# Patient Record
Sex: Female | Born: 1980 | Race: White | Hispanic: No | Marital: Married | Smoking: Never smoker
Health system: Southern US, Community
[De-identification: ages and names within clinical notes are randomized; demographics above are authoritative.]

## PROBLEM LIST (undated history)

## (undated) DIAGNOSIS — Z87442 Personal history of urinary calculi: Secondary | ICD-10-CM

## (undated) DIAGNOSIS — T8859XA Other complications of anesthesia, initial encounter: Secondary | ICD-10-CM

## (undated) DIAGNOSIS — D649 Anemia, unspecified: Secondary | ICD-10-CM

## (undated) DIAGNOSIS — O24419 Gestational diabetes mellitus in pregnancy, unspecified control: Secondary | ICD-10-CM

## (undated) DIAGNOSIS — Z8619 Personal history of other infectious and parasitic diseases: Secondary | ICD-10-CM

## (undated) DIAGNOSIS — R51 Headache: Secondary | ICD-10-CM

## (undated) DIAGNOSIS — O409XX0 Polyhydramnios, unspecified trimester, not applicable or unspecified: Secondary | ICD-10-CM

## (undated) DIAGNOSIS — E229 Hyperfunction of pituitary gland, unspecified: Secondary | ICD-10-CM

## (undated) DIAGNOSIS — Z9889 Other specified postprocedural states: Secondary | ICD-10-CM

## (undated) DIAGNOSIS — R112 Nausea with vomiting, unspecified: Secondary | ICD-10-CM

## (undated) DIAGNOSIS — T4145XA Adverse effect of unspecified anesthetic, initial encounter: Secondary | ICD-10-CM

## (undated) DIAGNOSIS — F419 Anxiety disorder, unspecified: Secondary | ICD-10-CM

## (undated) DIAGNOSIS — R569 Unspecified convulsions: Secondary | ICD-10-CM

## (undated) HISTORY — DX: Headache: R51

## (undated) HISTORY — DX: Anxiety disorder, unspecified: F41.9

## (undated) HISTORY — DX: Personal history of urinary calculi: Z87.442

## (undated) HISTORY — DX: Anemia, unspecified: D64.9

## (undated) HISTORY — DX: Personal history of other infectious and parasitic diseases: Z86.19

## (undated) HISTORY — DX: Hyperfunction of pituitary gland, unspecified: E22.9

## (undated) HISTORY — PX: WISDOM TOOTH EXTRACTION: SHX21

## (undated) HISTORY — DX: Polyhydramnios, unspecified trimester, not applicable or unspecified: O40.9XX0

---

## 2007-06-18 HISTORY — PX: BREAST SURGERY: SHX581

## 2008-02-10 ENCOUNTER — Encounter: Admission: RE | Admit: 2008-02-10 | Discharge: 2008-02-10 | Payer: Self-pay | Admitting: Obstetrics and Gynecology

## 2008-05-16 ENCOUNTER — Ambulatory Visit (HOSPITAL_BASED_OUTPATIENT_CLINIC_OR_DEPARTMENT_OTHER): Admission: RE | Admit: 2008-05-16 | Discharge: 2008-05-16 | Payer: Self-pay | Admitting: Internal Medicine

## 2008-05-18 ENCOUNTER — Encounter: Admission: RE | Admit: 2008-05-18 | Discharge: 2008-05-18 | Payer: Self-pay | Admitting: Obstetrics and Gynecology

## 2008-10-28 ENCOUNTER — Encounter: Admission: RE | Admit: 2008-10-28 | Discharge: 2008-10-28 | Payer: Self-pay | Admitting: Obstetrics and Gynecology

## 2010-07-08 ENCOUNTER — Encounter: Payer: Self-pay | Admitting: Obstetrics and Gynecology

## 2012-04-02 LAB — OB RESULTS CONSOLE RUBELLA ANTIBODY, IGM: Rubella: IMMUNE

## 2012-04-02 LAB — OB RESULTS CONSOLE ABO/RH: RH Type: POSITIVE

## 2012-04-02 LAB — OB RESULTS CONSOLE HEPATITIS B SURFACE ANTIGEN: Hepatitis B Surface Ag: NEGATIVE

## 2012-04-02 LAB — OB RESULTS CONSOLE HIV ANTIBODY (ROUTINE TESTING): HIV: NONREACTIVE

## 2012-04-02 LAB — OB RESULTS CONSOLE RPR: RPR: NONREACTIVE

## 2012-06-17 NOTE — L&D Delivery Note (Signed)
Delivery Note At 6:01 PM a viable and healthy female was delivered via  (Presentation: LOA ).  APGAR: 9, 9; weight pending.   Placenta status: spontaneous ,intact .  Cord:  with the following complications: [place] x one reduced.  Cord pH: na  Anesthesia: Epidural  Episiotomy: none Lacerations: second degree Suture Repair: 2.0 vicryl rapide Est. Blood Loss (mL): 300  Mom to postpartum.  Baby to nursery-stable.  Tari Lecount J 10/22/2012, 6:15 PM

## 2012-10-19 ENCOUNTER — Encounter (HOSPITAL_COMMUNITY): Payer: Self-pay | Admitting: *Deleted

## 2012-10-19 ENCOUNTER — Telehealth (HOSPITAL_COMMUNITY): Payer: Self-pay | Admitting: *Deleted

## 2012-10-19 NOTE — Telephone Encounter (Signed)
Preadmission screen  

## 2012-10-20 ENCOUNTER — Other Ambulatory Visit: Payer: Self-pay | Admitting: Obstetrics and Gynecology

## 2012-10-21 ENCOUNTER — Inpatient Hospital Stay (HOSPITAL_COMMUNITY)
Admission: AD | Admit: 2012-10-21 | Discharge: 2012-10-24 | DRG: 775 | Disposition: A | Discharge status: Home / Self Care | Payer: 59 | Source: Ambulatory Visit | Attending: Obstetrics and Gynecology | Admitting: Obstetrics and Gynecology

## 2012-10-21 ENCOUNTER — Encounter (HOSPITAL_COMMUNITY): Payer: Self-pay | Admitting: *Deleted

## 2012-10-21 DIAGNOSIS — O368190 Decreased fetal movements, unspecified trimester, not applicable or unspecified: Secondary | ICD-10-CM | POA: Diagnosis not present

## 2012-10-21 DIAGNOSIS — O409XX0 Polyhydramnios, unspecified trimester, not applicable or unspecified: Secondary | ICD-10-CM | POA: Diagnosis present

## 2012-10-21 DIAGNOSIS — D62 Acute posthemorrhagic anemia: Secondary | ICD-10-CM | POA: Diagnosis not present

## 2012-10-21 DIAGNOSIS — O3660X0 Maternal care for excessive fetal growth, unspecified trimester, not applicable or unspecified: Principal | ICD-10-CM | POA: Diagnosis present

## 2012-10-21 DIAGNOSIS — O99892 Other specified diseases and conditions complicating childbirth: Secondary | ICD-10-CM | POA: Diagnosis present

## 2012-10-21 DIAGNOSIS — O9903 Anemia complicating the puerperium: Secondary | ICD-10-CM | POA: Diagnosis not present

## 2012-10-21 DIAGNOSIS — Z22330 Carrier of Group B streptococcus: Secondary | ICD-10-CM

## 2012-10-21 LAB — TYPE AND SCREEN
ABO/RH(D): O POS
Antibody Screen: NEGATIVE

## 2012-10-21 LAB — CBC
MCH: 31.6 pg (ref 26.0–34.0)
MCHC: 35.5 g/dL (ref 30.0–36.0)
Platelets: 277 10*3/uL (ref 150–400)
RDW: 13.8 % (ref 11.5–15.5)

## 2012-10-21 MED ORDER — TERBUTALINE SULFATE 1 MG/ML IJ SOLN: 0.2500 mg | Freq: Once | INTRAMUSCULAR | Status: AC | PRN

## 2012-10-21 MED ORDER — LACTATED RINGERS IV SOLN: 500.0000 mL | INTRAVENOUS | Status: DC | PRN

## 2012-10-21 MED ORDER — ONDANSETRON HCL 4 MG/2ML IJ SOLN: 4.0000 mg | Freq: Four times a day (QID) | INTRAMUSCULAR | Status: DC | PRN

## 2012-10-21 MED ORDER — FLEET ENEMA 7-19 GM/118ML RE ENEM: 1.0000 | ENEMA | RECTAL | Status: DC | PRN

## 2012-10-21 MED ORDER — LIDOCAINE HCL (PF) 1 % IJ SOLN
30.0000 mL | INTRAMUSCULAR | Status: DC | PRN
  Filled 2012-10-21 (×2): qty 30

## 2012-10-21 MED ORDER — OXYTOCIN BOLUS FROM INFUSION: 500.0000 mL | INTRAVENOUS | Status: DC

## 2012-10-21 MED ORDER — OXYCODONE-ACETAMINOPHEN 5-325 MG PO TABS: 1.0000 | ORAL_TABLET | ORAL | Status: DC | PRN

## 2012-10-21 MED ORDER — ZOLPIDEM TARTRATE 5 MG PO TABS
5.0000 mg | ORAL_TABLET | Freq: Every evening | ORAL | Status: DC | PRN
  Administered 2012-10-21: 5 mg via ORAL
  Filled 2012-10-21: qty 1

## 2012-10-21 MED ORDER — PENICILLIN G POTASSIUM 5000000 UNITS IJ SOLR: 5.0000 10*6.[IU] | Freq: Once | INTRAVENOUS | Status: DC

## 2012-10-21 MED ORDER — MISOPROSTOL 25 MCG QUARTER TABLET
25.0000 ug | ORAL_TABLET | ORAL | Status: DC | PRN
  Administered 2012-10-21 – 2012-10-22 (×2): 25 ug via VAGINAL
  Filled 2012-10-21 (×2): qty 0.25
  Filled 2012-10-21: qty 1

## 2012-10-21 MED ORDER — PENICILLIN G POTASSIUM 5000000 UNITS IJ SOLR
5.0000 10*6.[IU] | Freq: Once | INTRAVENOUS | Status: DC
  Administered 2012-10-21: 5 10*6.[IU] via INTRAVENOUS
  Filled 2012-10-21: qty 5

## 2012-10-21 MED ORDER — LIDOCAINE HCL (PF) 1 % IJ SOLN: 30.0000 mL | INTRAMUSCULAR | Status: DC | PRN

## 2012-10-21 MED ORDER — CITRIC ACID-SODIUM CITRATE 334-500 MG/5ML PO SOLN
30.0000 mL | ORAL | Status: DC | PRN
  Administered 2012-10-21: 30 mL via ORAL
  Filled 2012-10-21: qty 15

## 2012-10-21 MED ORDER — PENICILLIN G POTASSIUM 5000000 UNITS IJ SOLR: 2.5000 10*6.[IU] | INTRAVENOUS | Status: DC

## 2012-10-21 MED ORDER — ACETAMINOPHEN 325 MG PO TABS: 650.0000 mg | ORAL_TABLET | ORAL | Status: DC | PRN

## 2012-10-21 MED ORDER — IBUPROFEN 600 MG PO TABS
600.0000 mg | ORAL_TABLET | Freq: Four times a day (QID) | ORAL | Status: DC | PRN
  Administered 2012-10-22: 600 mg via ORAL
  Filled 2012-10-21: qty 1

## 2012-10-21 MED ORDER — PENICILLIN G POTASSIUM 5000000 UNITS IJ SOLR
5.0000 10*6.[IU] | Freq: Once | INTRAVENOUS | Status: AC
  Administered 2012-10-22: 5 10*6.[IU] via INTRAVENOUS
  Filled 2012-10-21: qty 5

## 2012-10-21 MED ORDER — OXYTOCIN 40 UNITS IN LACTATED RINGERS INFUSION - SIMPLE MED: 62.5000 mL/h | INTRAVENOUS | Status: DC

## 2012-10-21 MED ORDER — LACTATED RINGERS IV SOLN
INTRAVENOUS | Status: DC
  Administered 2012-10-21: 20:00:00 via INTRAVENOUS

## 2012-10-21 MED ORDER — CITRIC ACID-SODIUM CITRATE 334-500 MG/5ML PO SOLN: 30.0000 mL | ORAL | Status: DC | PRN

## 2012-10-21 MED ORDER — BUTORPHANOL TARTRATE 1 MG/ML IJ SOLN: 1.0000 mg | INTRAMUSCULAR | Status: DC | PRN

## 2012-10-21 MED ORDER — LACTATED RINGERS IV SOLN
INTRAVENOUS | Status: DC
  Administered 2012-10-22 (×2): via INTRAVENOUS

## 2012-10-21 MED ORDER — PENICILLIN G POTASSIUM 5000000 UNITS IJ SOLR
2.5000 10*6.[IU] | INTRAVENOUS | Status: DC
  Administered 2012-10-22 (×2): 2.5 10*6.[IU] via INTRAVENOUS
  Filled 2012-10-21 (×5): qty 2.5

## 2012-10-21 MED ORDER — IBUPROFEN 600 MG PO TABS: 600.0000 mg | ORAL_TABLET | Freq: Four times a day (QID) | ORAL | Status: DC | PRN

## 2012-10-21 MED ORDER — PENICILLIN G POTASSIUM 5000000 UNITS IJ SOLR
2.5000 10*6.[IU] | INTRAVENOUS | Status: DC
  Filled 2012-10-21 (×2): qty 2.5

## 2012-10-21 MED ORDER — OXYTOCIN 40 UNITS IN LACTATED RINGERS INFUSION - SIMPLE MED
1.0000 m[IU]/min | INTRAVENOUS | Status: DC
  Administered 2012-10-22: 2 m[IU]/min via INTRAVENOUS
  Filled 2012-10-21: qty 1000

## 2012-10-21 NOTE — Progress Notes (Signed)
Yvonne Hurst is a 32 y.o. G1P0 at [redacted]w[redacted]d by LMP admitted for induction of labor due to moderate Hydramnios and LGA with increased AC, abnl 1gtt with nl 3gtt and new onset decreased FM.  Subjective: Comfortable  Objective: BP 136/86  Pulse 73  Temp(Src) 98.2 F (36.8 C) (Oral)  Resp 18  Ht 5\' 4"  (1.626 m)  Wt 67.586 kg (149 lb)  BMI 25.56 kg/m2  LMP 01/28/2012      FHT:  FHR: 155 bpm, variability: moderate,  accelerations:  Present,  decelerations:  Absent UC:   none SVE:    2-3/70/-1  Labs: LAbs pending No results found for this basename: WBC, HGB, HCT, MCV, PLT    Assessment / Plan: 38 weeks LGA with AC .90th percentile Moderate Polyhydramnios New onset decreased FM GBS positive  Labor: Progressing normally Preeclampsia:  na Fetal Wellbeing:  Category I Pain Control:  Labor support without medications I/D:  n/a Anticipated MOD:  NSVD  Tavish Gettis J 10/21/2012, 8:45 PM

## 2012-10-22 ENCOUNTER — Encounter (HOSPITAL_COMMUNITY): Payer: Self-pay | Admitting: Anesthesiology

## 2012-10-22 ENCOUNTER — Inpatient Hospital Stay (HOSPITAL_COMMUNITY): Admission: RE | Admit: 2012-10-22 | Payer: 59 | Source: Ambulatory Visit

## 2012-10-22 ENCOUNTER — Inpatient Hospital Stay (HOSPITAL_COMMUNITY): Payer: 59 | Admitting: Anesthesiology

## 2012-10-22 LAB — RPR: RPR Ser Ql: NONREACTIVE

## 2012-10-22 MED ORDER — EPHEDRINE 5 MG/ML INJ
10.0000 mg | INTRAVENOUS | Status: DC | PRN
  Filled 2012-10-22: qty 2

## 2012-10-22 MED ORDER — FENTANYL 2.5 MCG/ML BUPIVACAINE 1/10 % EPIDURAL INFUSION (WH - ANES)
INTRAMUSCULAR | Status: DC | PRN
  Administered 2012-10-22: 14 mL/h via EPIDURAL

## 2012-10-22 MED ORDER — DIPHENHYDRAMINE HCL 50 MG/ML IJ SOLN: 12.5000 mg | INTRAMUSCULAR | Status: DC | PRN

## 2012-10-22 MED ORDER — PHENYLEPHRINE 40 MCG/ML (10ML) SYRINGE FOR IV PUSH (FOR BLOOD PRESSURE SUPPORT)
80.0000 ug | PREFILLED_SYRINGE | INTRAVENOUS | Status: DC | PRN
  Filled 2012-10-22: qty 2
  Filled 2012-10-22: qty 5

## 2012-10-22 MED ORDER — TETANUS-DIPHTH-ACELL PERTUSSIS 5-2.5-18.5 LF-MCG/0.5 IM SUSP
0.5000 mL | Freq: Once | INTRAMUSCULAR | Status: AC
  Administered 2012-10-23: 0.5 mL via INTRAMUSCULAR
  Filled 2012-10-22: qty 0.5

## 2012-10-22 MED ORDER — PHENYLEPHRINE 40 MCG/ML (10ML) SYRINGE FOR IV PUSH (FOR BLOOD PRESSURE SUPPORT)
80.0000 ug | PREFILLED_SYRINGE | INTRAVENOUS | Status: DC | PRN
  Filled 2012-10-22: qty 2

## 2012-10-22 MED ORDER — PRENATAL MULTIVITAMIN CH
1.0000 | ORAL_TABLET | Freq: Every day | ORAL | Status: DC
  Filled 2012-10-22 (×2): qty 1

## 2012-10-22 MED ORDER — LANOLIN HYDROUS EX OINT: TOPICAL_OINTMENT | CUTANEOUS | Status: DC | PRN

## 2012-10-22 MED ORDER — METHYLERGONOVINE MALEATE 0.2 MG/ML IJ SOLN: 0.2000 mg | INTRAMUSCULAR | Status: DC | PRN

## 2012-10-22 MED ORDER — ONDANSETRON HCL 4 MG PO TABS: 4.0000 mg | ORAL_TABLET | ORAL | Status: DC | PRN

## 2012-10-22 MED ORDER — BENZOCAINE-MENTHOL 20-0.5 % EX AERO
1.0000 "application " | INHALATION_SPRAY | CUTANEOUS | Status: DC | PRN
  Administered 2012-10-22 – 2012-10-24 (×2): 1 via TOPICAL
  Filled 2012-10-22 (×2): qty 56

## 2012-10-22 MED ORDER — METHYLERGONOVINE MALEATE 0.2 MG PO TABS: 0.2000 mg | ORAL_TABLET | ORAL | Status: DC | PRN

## 2012-10-22 MED ORDER — ZOLPIDEM TARTRATE 5 MG PO TABS: 5.0000 mg | ORAL_TABLET | Freq: Every evening | ORAL | Status: DC | PRN

## 2012-10-22 MED ORDER — DIPHENHYDRAMINE HCL 25 MG PO CAPS: 25.0000 mg | ORAL_CAPSULE | Freq: Four times a day (QID) | ORAL | Status: DC | PRN

## 2012-10-22 MED ORDER — WITCH HAZEL-GLYCERIN EX PADS: 1.0000 "application " | MEDICATED_PAD | CUTANEOUS | Status: DC | PRN

## 2012-10-22 MED ORDER — SIMETHICONE 80 MG PO CHEW: 80.0000 mg | CHEWABLE_TABLET | ORAL | Status: DC | PRN

## 2012-10-22 MED ORDER — LACTATED RINGERS IV SOLN: 500.0000 mL | Freq: Once | INTRAVENOUS | Status: DC

## 2012-10-22 MED ORDER — LIDOCAINE HCL (PF) 1 % IJ SOLN
INTRAMUSCULAR | Status: DC | PRN
  Administered 2012-10-22: 8 mL
  Administered 2012-10-22: 9 mL

## 2012-10-22 MED ORDER — EPHEDRINE 5 MG/ML INJ
10.0000 mg | INTRAVENOUS | Status: DC | PRN
  Filled 2012-10-22: qty 4
  Filled 2012-10-22: qty 2

## 2012-10-22 MED ORDER — FENTANYL 2.5 MCG/ML BUPIVACAINE 1/10 % EPIDURAL INFUSION (WH - ANES)
14.0000 mL/h | INTRAMUSCULAR | Status: DC | PRN
  Administered 2012-10-22: 14 mL/h via EPIDURAL
  Filled 2012-10-22 (×2): qty 125

## 2012-10-22 MED ORDER — ONDANSETRON HCL 4 MG/2ML IJ SOLN: 4.0000 mg | INTRAMUSCULAR | Status: DC | PRN

## 2012-10-22 MED ORDER — OXYCODONE-ACETAMINOPHEN 5-325 MG PO TABS
1.0000 | ORAL_TABLET | ORAL | Status: DC | PRN
  Administered 2012-10-23: 1 via ORAL
  Filled 2012-10-22: qty 1
  Filled 2012-10-22: qty 2

## 2012-10-22 MED ORDER — SENNOSIDES-DOCUSATE SODIUM 8.6-50 MG PO TABS
2.0000 | ORAL_TABLET | Freq: Every day | ORAL | Status: DC
  Administered 2012-10-22 – 2012-10-23 (×2): 2 via ORAL

## 2012-10-22 MED ORDER — DIBUCAINE 1 % RE OINT: 1.0000 "application " | TOPICAL_OINTMENT | RECTAL | Status: DC | PRN

## 2012-10-22 MED ORDER — IBUPROFEN 600 MG PO TABS
600.0000 mg | ORAL_TABLET | Freq: Four times a day (QID) | ORAL | Status: DC
  Administered 2012-10-23 – 2012-10-24 (×6): 600 mg via ORAL
  Filled 2012-10-22 (×6): qty 1

## 2012-10-22 NOTE — Anesthesia Procedure Notes (Signed)
Epidural Patient location during procedure: OB Start time: 10/22/2012 9:15 AM End time: 10/22/2012 9:22 AM  Staffing Anesthesiologist: Sandrea Hughs Performed by: anesthesiologist   Preanesthetic Checklist Completed: patient identified, site marked, surgical consent, pre-op evaluation, timeout performed, IV checked, risks and benefits discussed and monitors and equipment checked  Epidural Patient position: sitting Prep: site prepped and draped and DuraPrep Patient monitoring: continuous pulse ox and blood pressure Approach: midline Injection technique: LOR air  Needle:  Needle type: Tuohy  Needle gauge: 17 G Needle length: 9 cm and 9 Needle insertion depth: 5 cm cm Catheter type: closed end flexible Catheter size: 19 Gauge Catheter at skin depth: 10 cm Test dose: negative and Other  Assessment Sensory level: T8 Events: blood not aspirated, injection not painful, no injection resistance, negative IV test and no paresthesia  Additional Notes Reason for block:procedure for pain

## 2012-10-22 NOTE — Progress Notes (Signed)
Yvonne Hurst is a 32 y.o. G1P0 at [redacted]w[redacted]d by LMP admitted for induction of labor due to moderate Hydramnios, LGA with AC > 90th percentile, and new onset dec FM. Induction discussed with MFM.  Subjective: Feels contractions.  Objective: BP 114/79  Pulse 72  Temp(Src) 97.9 F (36.6 C) (Oral)  Resp 18  Ht 5\' 4"  (1.626 m)  Wt 67.586 kg (149 lb)  BMI 25.56 kg/m2  LMP 01/28/2012      FHT:  FHR: 155 bpm, variability: moderate,  accelerations:  Present,  decelerations:  Absent UC:   irregular, every 5 minutes SVE:   3-4/90/0 AROM- clear  Labs: Lab Results  Component Value Date   WBC 10.9* 10/21/2012   HGB 11.8* 10/21/2012   HCT 33.2* 10/21/2012   MCV 88.8 10/21/2012   PLT 277 10/21/2012    Assessment / Plan: Induction of labor due to above noted indications,  progressing well on pitocin  Labor: Progressing on Pitocin, will continue to increase then AROM Preeclampsia:  na Fetal Wellbeing:  Category I Pain Control:  Labor support without medications I/D:  n/a Anticipated MOD:  NSVD  Yvonne Hurst J 10/22/2012, 7:19 AM

## 2012-10-22 NOTE — Progress Notes (Signed)
Admission nutrition screen triggered. Patients chart reviewed and assessed  for nutritional risk. Pt with 29 Lb weight gain, no weight loss. Patient is determined to be at low nutrition  risk.   Elisabeth Cara M.Odis Luster LDN Neonatal Nutrition Support Specialist Pager [phone]

## 2012-10-22 NOTE — Anesthesia Preprocedure Evaluation (Signed)
Anesthesia Evaluation  Patient identified by MRN, date of birth, ID band Patient awake    Reviewed: Allergy & Precautions, H&P , NPO status , Patient's Chart, lab work & pertinent test results  Airway Mallampati: II TM Distance: >3 FB Neck ROM: full    Dental no notable dental hx.    Pulmonary neg pulmonary ROS,    Pulmonary exam normal       Cardiovascular negative cardio ROS      Neuro/Psych    GI/Hepatic negative GI ROS, Neg liver ROS,   Endo/Other  negative endocrine ROS  Renal/GU negative Renal ROS  negative genitourinary   Musculoskeletal negative musculoskeletal ROS (+)   Abdominal Normal abdominal exam  (+)   Peds negative pediatric ROS (+)  Hematology negative hematology ROS (+)   Anesthesia Other Findings   Reproductive/Obstetrics (+) Pregnancy                           Anesthesia Physical Anesthesia Plan  ASA: II  Anesthesia Plan: Epidural   Post-op Pain Management:    Induction:   Airway Management Planned:   Additional Equipment:   Intra-op Plan:   Post-operative Plan:   Informed Consent: I have reviewed the patients History and Physical, chart, labs and discussed the procedure including the risks, benefits and alternatives for the proposed anesthesia with the patient or authorized representative who has indicated his/her understanding and acceptance.     Plan Discussed with:   Anesthesia Plan Comments:         Anesthesia Quick Evaluation

## 2012-10-22 NOTE — H&P (Signed)
NAME:  Yvonne Hurst, Yvonne Hurst                ACCOUNT NO.:  [id number]  MEDICAL RECORD NO.:  [id number]  LOCATION:  9170                          FACILITY:  [facility]  PHYSICIAN:  Lenoard Aden, M.D.DATE OF BIRTH:  02-05-1981  DATE OF ADMISSION:  10/21/2012 DATE OF DISCHARGE:                             HISTORY & PHYSICAL   CHIEF COMPLAINT:  Moderate polyhydramnios, abnormal 1-hour GTT with 1 abnormal value on 3-hour GTT with persistent LGA, fetal weight greater than the 98th percentile and increased abdominal circumference greater than the 90th percentile, now with new onset decreased fetal movement for cervical ripening and induction at 38 weeks.  This case discussed with Dr. Particia Nearing Maternal Fetal Medicine due to decision to proceed with delivery before 39 weeks.  SHE HAS ALLERGIES TO SULFA ANTIBIOTIC.  She is on medications include prenatal vitamins.  She is a nonsmoker, nondrinker.  She denies domestic or physical violence.  She has other medications to include Zantac as needed, probiotic as needed, and Zyrtec as needed.  FAMILY HISTORY:  Insulin-dependent diabetes, colon cancer, heart disease, diabetes, breast cancer.  SURGICAL HISTORY:  Remarkable for breast biopsy for fibroadenoma in 2009.  Prenatal course complicated as noted. 1. GBS bacteriuria. 2. Abnormal 1-hour GTT with 1 abnormal value on 3-hour GTT. 3. Persistent LGA with increased abdominal circumference for new onset     decreased fetal movement.  PHYSICAL EXAMINATION:  GENERAL:  She is a well-developed, well- nourished, white female, in no acute distress. HEENT:  Normal. NECK:  Supple.  Full range of motion. LUNGS:  Clear. HEART:  Regular rhythm. ABDOMEN:  Soft, gravid, and nontender.  Estimated fetal weight 8 pounds. Cervix is 2-3 cm, 80%, vertex, -1 to 0 station. EXTREMITIES:  There are no cords. NEUROLOGIC:  Nonfocal. SKIN:  Intact.  Labs are pending.  NST is reactive.  Rare contractions  noted.  IMPRESSION: 1. A 38-week intrauterine pregnancy. 2. Moderate polyhydramnios. 3. Large gestational age fetus greater than 90th percentile with     abdominal circumference and concerns for fetal hyperglycemia     greater than the 98th percentile. 4. History of abnormal 1-hour GTT, history of 1 abnormal value on 3-     hour GTT. 5. GBS bacteriuria.  PLAN:  Admit.  Cytotec, Pitocin in a.m.  Epidural as needed.  Penicillin prophylaxis, 1 in active labor.  Anticipate attempts at vaginal delivery.     Lenoard Aden, M.D.     RJT/MEDQ  D:  10/21/2012  T:  10/22/2012  Job:  [id number]

## 2012-10-23 ENCOUNTER — Encounter (HOSPITAL_COMMUNITY): Payer: Self-pay | Admitting: Obstetrics and Gynecology

## 2012-10-23 LAB — CBC
HCT: 31.5 % — ABNORMAL LOW (ref 36.0–46.0)
MCH: 30.6 pg (ref 26.0–34.0)
MCHC: 34.6 g/dL (ref 30.0–36.0)
MCV: 88.5 fL (ref 78.0–100.0)
RDW: 13.4 % (ref 11.5–15.5)
WBC: 11.1 10*3/uL — ABNORMAL HIGH (ref 4.0–10.5)

## 2012-10-23 NOTE — Anesthesia Postprocedure Evaluation (Signed)
  Anesthesia Post-op Note  Patient: Yvonne Hurst  Procedure(s) Performed: * No procedures listed *  Patient Location: Mother/Baby  Anesthesia Type:Epidural  Level of Consciousness: awake  Airway and Oxygen Therapy: Patient Spontanous Breathing  Post-op Pain: none  Post-op Assessment: Patient's Cardiovascular Status Stable, Respiratory Function Stable, Patent Airway, No signs of Nausea or vomiting, Adequate PO intake, Pain level controlled, No headache, No backache, No residual numbness and No residual motor weakness  Post-op Vital Signs: Reviewed and stable  Complications: No apparent anesthesia complications

## 2012-10-23 NOTE — Progress Notes (Signed)
Patient ID: Yvonne Hurst, female   DOB: 05/07/1981, 32 y.o.   MRN: [id number] PPD # 1  Subjective: Pt reports feeling well/ Pain controlled with ibuprofen and percocet Tolerating po/ Voiding without problems/ No n/v Bleeding is moderate Newborn info:  Information for the patient's newborn:  Reyanne, Hussar [[id number]]  female  / circ completed/ Feeding: breast   Objective:  VS: Blood pressure 114/78, pulse 61, temperature 97.5 F (36.4 C), temperature source Oral, resp. rate 16.    Recent Labs  10/21/12 1953 10/23/12 0615  WBC 10.9* 11.1*  HGB 11.8* 10.9*  HCT 33.2* 31.5*  PLT 277 199    Blood type: --/--/O POS, O POS (05/07 1953) Rubella: Immune (10/17 0000)    Physical Exam:  General:  alert, cooperative and no distress CV: Regular rate and rhythm Resp: clear Abdomen: soft, nontender, normal bowel sounds Uterine Fundus: firm, below umbilicus, nontender Perineum: healing with good reapproximation Lochia: moderate Ext: Homans sign is negative, no sign of DVT and no edema, redness or tenderness in the calves or thighs   A/P: PPD # 1/ G1P0/ S/P: SVD with 2nd deg repair Hx anxiety; stable at present Doing well Continue routine post partum orders Anticipate D/C home in AM    Demetrius Revel, MSN, [facility] 10/23/2012, 10:44 AM

## 2012-10-23 NOTE — Lactation Note (Signed)
This note was copied from the chart of Yvonne Hurst. Lactation Consultation Note Mom called request lactation assistance, states baby is starting to wake up a little and wiggle. Initial consult; baby now 33 hours old, mom states he has been very sleepy. Offered to assist with latch, mom accepts.  Assisted mom with position and latch, baby latched very well then went directly to sleep at the breast without feeding. Enc mom to continue frequent STS and cue based feeding, and to call for assistance if needed.  Reviewed br feeding basics and answered questions. Lactation brochure provided and mom made aware of lactation services and BFSG.  Patient Name: Yvonne Hurst Date: 10/23/2012 Reason for consult: Initial assessment   Maternal Data Formula Feeding for Exclusion: No Has patient been taught Hand Expression?: Yes Does the patient have breastfeeding experience prior to this delivery?: No  Feeding Feeding Type: Breast Milk Feeding method: Breast  LATCH Score/Interventions Latch: Too sleepy or reluctant, no latch achieved, no sucking elicited.  Audible Swallowing: None  Type of Nipple: Everted at rest and after stimulation  Comfort (Breast/Nipple): Soft / non-tender     Hold (Positioning): Assistance needed to correctly position infant at breast and maintain latch.  LATCH Score: 5  Lactation Tools Discussed/Used     Consult Status Consult Status: Follow-up Follow-up type: In-patient    Octavio Manns [facility] 10/23/2012, 1:46 PM

## 2012-10-23 NOTE — Progress Notes (Signed)
Patient was referred for history of depression/anxiety.  * Referral screened out by Clinical Social Worker because none of the following criteria appear to apply:  ~ History of anxiety/depression during this pregnancy, or of post-partum depression.  ~ Diagnosis of anxiety and/or depression within last 3 years  ~ History of depression due to pregnancy loss/loss of child  OR  * Patient's symptoms currently being treated with medication and/or therapy.  Please contact the Clinical Social Worker if needs arise, or by the patient's request.  Pt's anxiety symptoms are related to medical procedures.

## 2012-10-24 MED ORDER — DOCUSATE SODIUM 100 MG PO CAPS: 100.0000 mg | ORAL_CAPSULE | Freq: Three times a day (TID) | ORAL | Status: DC | PRN

## 2012-10-24 MED ORDER — IBUPROFEN 600 MG PO TABS: 600.0000 mg | ORAL_TABLET | Freq: Four times a day (QID) | ORAL | Status: DC | PRN

## 2012-10-24 MED ORDER — FERRALET 90 90-1 MG PO TABS: 1.0000 | ORAL_TABLET | Freq: Every day | ORAL | Status: DC

## 2012-10-24 MED ORDER — OXYCODONE-ACETAMINOPHEN 5-325 MG PO TABS: 1.0000 | ORAL_TABLET | Freq: Four times a day (QID) | ORAL | Status: DC | PRN

## 2012-10-24 NOTE — Discharge Summary (Signed)
Obstetric Discharge Summary Reason for Admission: G1 P0 @ 38w 2d with LGA, moderate polyhydramnios, with abnormal 1 hr and 3hr GTT, as well as decreased fetal movement Prenatal Procedures: NST and ultrasound Intrapartum Procedures: spontaneous vaginal delivery Postpartum Procedures: none Complications-Operative and Postpartum: 2nd degree perineal laceration Hemoglobin  Date Value Range Status  10/23/2012 10.9* 12.0 - 15.0 g/dL Final     HCT  Date Value Range Status  10/23/2012 31.5* 36.0 - 46.0 % Final    Physical Exam:  General: alert, cooperative and no distress Lochia: appropriate Uterine Fundus: firm Incision: na DVT Evaluation: No evidence of DVT seen on physical exam.  Discharge Diagnoses: G1 P1 s/p SVD with 2nd degree laceration with repair Mild Chronic and ABL anemia  Discharge Information: Date: 10/24/2012 Activity: pelvic rest Diet: routine Medications: PNV, Ibuprofen, Colace, Iron and Percocet Condition: stable Instructions: refer to practice specific booklet Discharge to: home Follow-up Information   Follow up with Lenoard Aden, MD In 6 weeks.   Contact information:   Yvonne Hurst [id number] [phone]       Newborn Data: Live born female on 10/22/12 Birth Weight: 7 lb 13 oz (3544 g) APGAR: 8, 9  Home with mother.  Yvonne Hurst K 10/24/2012, 10:34 AM

## 2012-10-24 NOTE — Progress Notes (Signed)
Patient ID: Yvonne Hurst, female   DOB: [birth year], 32 y.o.   MRN: [id number] PPD # 2  Subjective: Pt reports feeling well.  Somewhat anxious regarding d/c to home.  Infant continues to have episodes of spitting up, but peds has been reassuring and ok to clear infant for d/c home. Requests additional lactation visit before d/c home Pain controlled with ibuprofen and percocet Tolerating po/ Voiding without problems/ No n/v Bleeding is light/ Newborn info:  Information for the patient's newborn:  Yvonne, Hurst [[id number]]  female  / circ completed/ Feeding: breast    Objective:  VS: Blood pressure 138/77, pulse 58, temperature 98.1 F (36.7 C), temperature source Oral, resp. rate 18.    Recent Labs  10/21/12 1953 10/23/12 0615  WBC 10.9* 11.1*  HGB 11.8* 10.9*  HCT 33.2* 31.5*  PLT 277 199    Blood type:  O POS (05/07 1953) Rubella: Immune (10/17 0000)    Physical Exam:  General:  alert, cooperative and no distress CV: Regular rate and rhythm Resp: clear Abdomen: soft, nontender, normal bowel sounds Uterine Fundus: firm, below umbilicus, nontender Perineum: healing with good reapproximation Lochia: minimal Ext: Homans sign is negative, no sign of DVT and no edema, redness or tenderness in the calves or thighs    A/P: PPD # 2/ G1P1001/ S/P: SVD w/2nd deg lac w/repair Chronic and ABL anemia; will add stool softener Doing well and stable for discharge home RX: Ibuprofen 600mg  po Q 6 hrs prn pain #30 Refill x 1 Ferralet 90 po QD # 30 ref x 1 Percocet 5/325 1 to 2 po Q 4 hrs prn pain #15 No refill Colace 100mg  po TID prn #60 Ref x 1 WOB/GYN booklet given Routine pp visit in 6wks   Demetrius Revel, MSN, [facility] 10/24/2012, 9:37 AM

## 2014-04-18 ENCOUNTER — Encounter (HOSPITAL_COMMUNITY): Payer: Self-pay | Admitting: Obstetrics and Gynecology

## 2014-07-13 ENCOUNTER — Other Ambulatory Visit: Payer: Self-pay | Admitting: Internal Medicine

## 2014-07-13 ENCOUNTER — Ambulatory Visit
Admission: RE | Admit: 2014-07-13 | Discharge: 2014-07-13 | Disposition: A | Discharge status: Home / Self Care | Payer: Managed Care, Other (non HMO) | Source: Ambulatory Visit | Attending: Internal Medicine | Admitting: Internal Medicine

## 2014-07-13 DIAGNOSIS — M542 Cervicalgia: Secondary | ICD-10-CM

## 2014-07-13 DIAGNOSIS — R079 Chest pain, unspecified: Secondary | ICD-10-CM

## 2015-12-17 IMAGING — CR DG CHEST 2V
2 series · 2 of 2 positions shown · non-contrast
Comparison: None.

CLINICAL DATA: Right anterior chest pain radiating to neck and arms
for greater than 1 year. Initial encounter.

EXAM:
CHEST  2 VIEW

[view not recorded (1 of 2)]
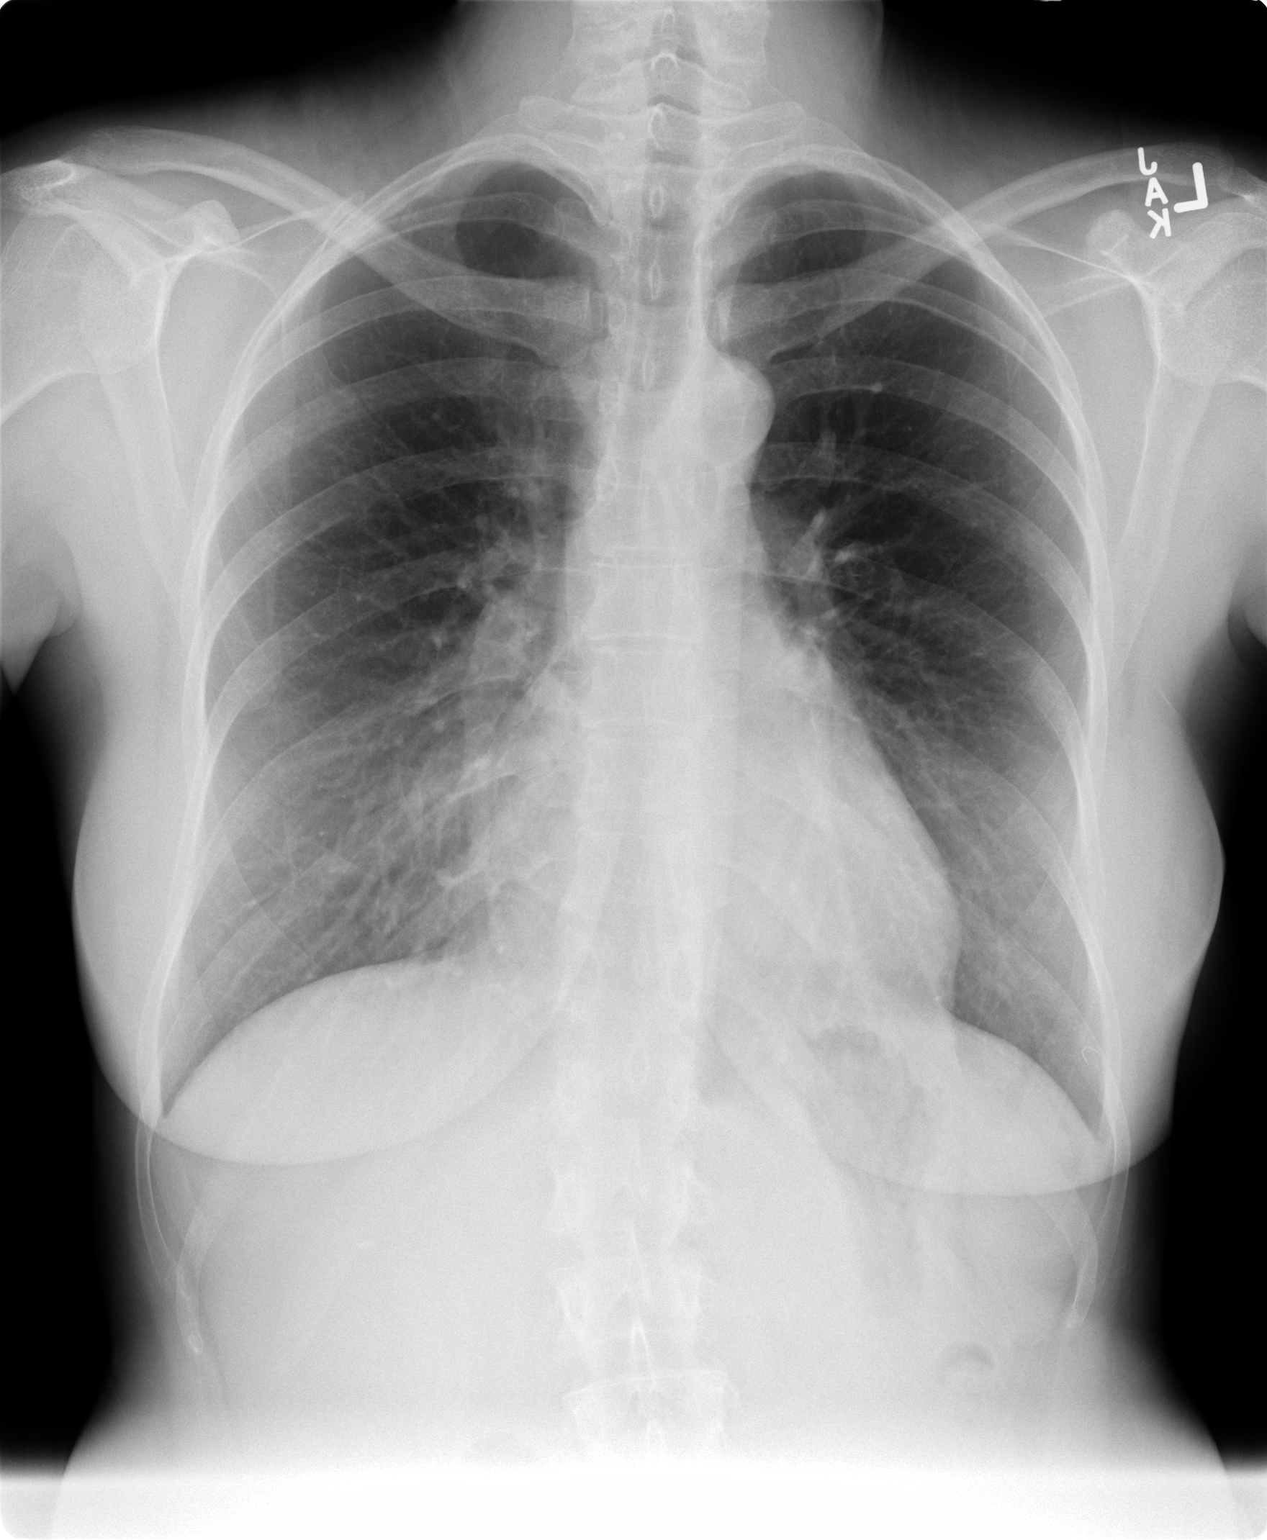

[view not recorded (2 of 2)]
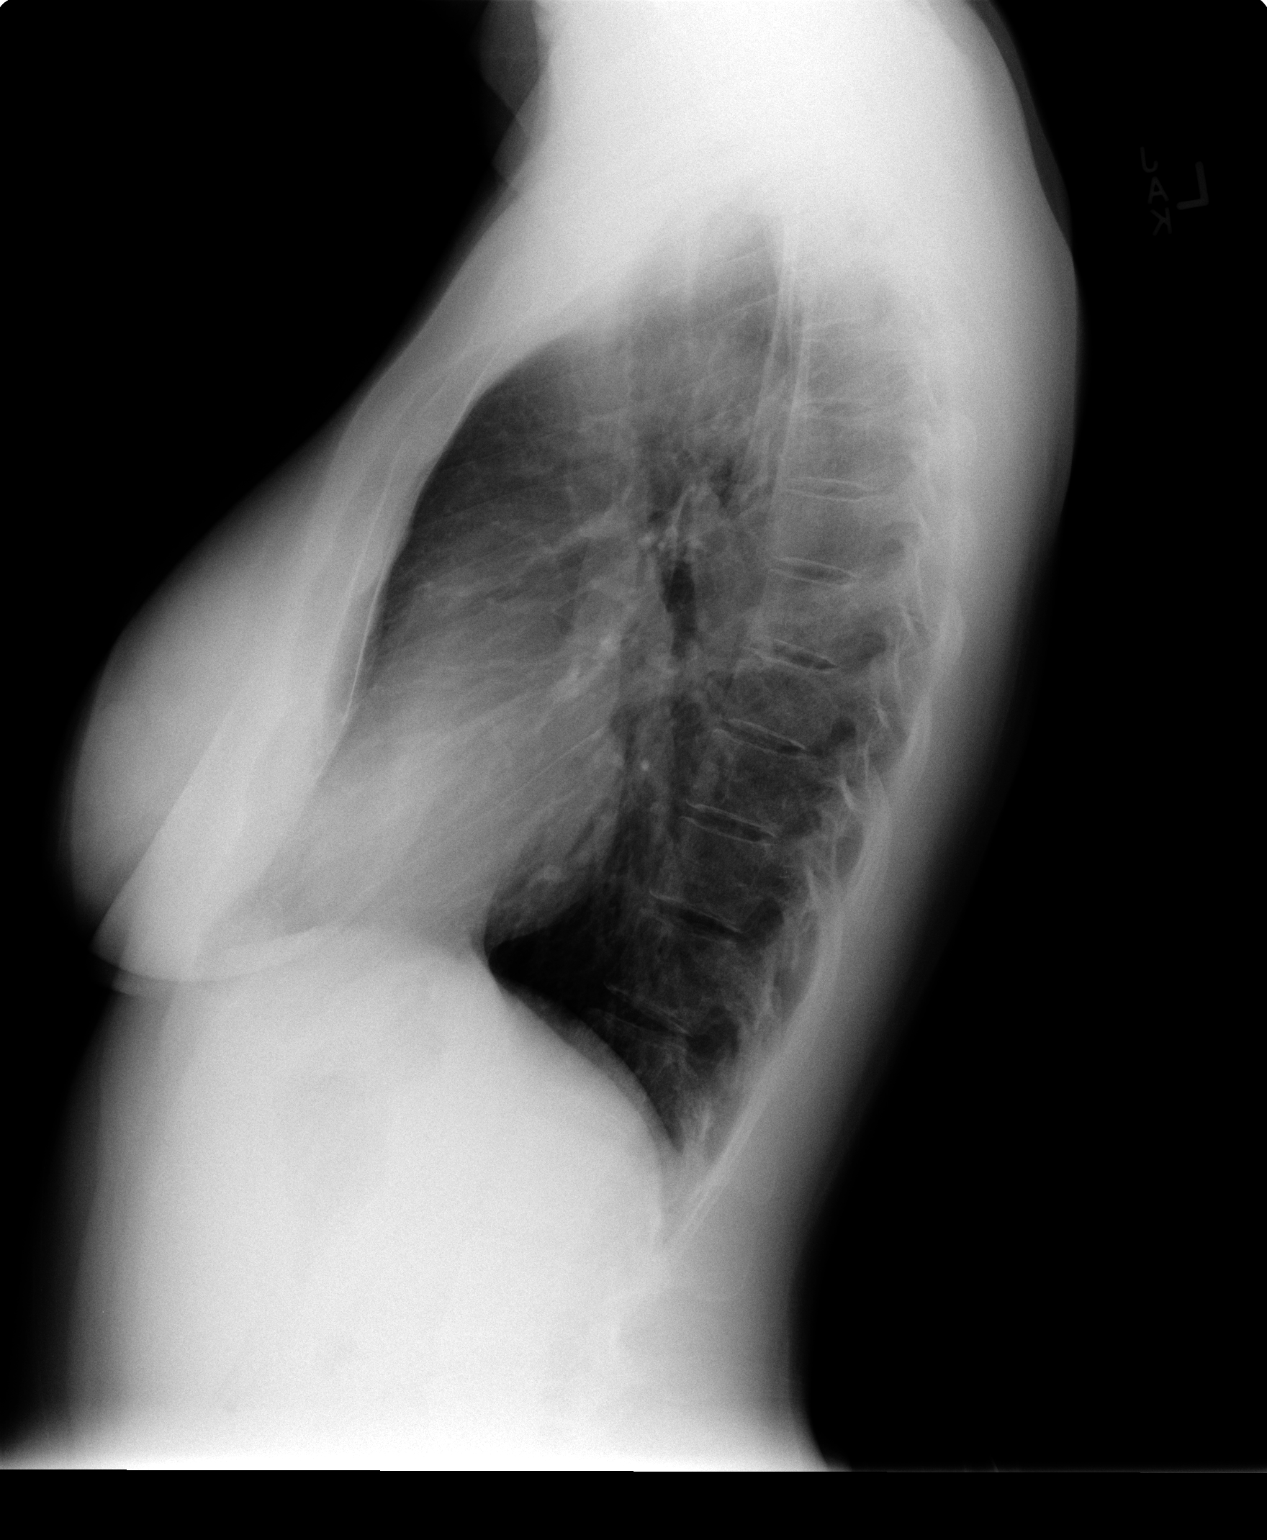

[2 of 2 positions shown; findings below may reference images not displayed]

FINDINGS: Normal mediastinum and cardiac silhouette. Normal pulmonary
vasculature. No evidence of effusion, infiltrate, or pneumothorax.
No acute bony abnormality.
IMPRESSION: No acute cardiopulmonary process.

## 2016-05-22 ENCOUNTER — Other Ambulatory Visit (HOSPITAL_COMMUNITY): Payer: Self-pay | Admitting: Obstetrics and Gynecology

## 2016-05-22 DIAGNOSIS — N979 Female infertility, unspecified: Secondary | ICD-10-CM

## 2016-05-27 ENCOUNTER — Encounter (INDEPENDENT_AMBULATORY_CARE_PROVIDER_SITE_OTHER): Payer: Self-pay

## 2016-05-27 ENCOUNTER — Ambulatory Visit (HOSPITAL_COMMUNITY)
Admission: RE | Admit: 2016-05-27 | Discharge: 2016-05-27 | Disposition: A | Discharge status: Home / Self Care | Payer: 59 | Source: Ambulatory Visit | Attending: Obstetrics and Gynecology | Admitting: Obstetrics and Gynecology

## 2016-05-27 DIAGNOSIS — N970 Female infertility associated with anovulation: Secondary | ICD-10-CM | POA: Diagnosis not present

## 2016-05-27 DIAGNOSIS — N979 Female infertility, unspecified: Secondary | ICD-10-CM

## 2016-05-27 MED ORDER — IOPAMIDOL (ISOVUE-300) INJECTION 61%
30.0000 mL | Freq: Once | INTRAVENOUS | Status: AC | PRN
  Administered 2016-05-27: 3 mL

## 2016-06-17 NOTE — L&D Delivery Note (Signed)
Delivery Note   [name], BoyA Destiny [[id number]][[id number]]  At 12:58 PM a viable and healthy female was delivered via Vaginal, Spontaneous Delivery (Presentation: LOA  ).  APGAR: 8, 9; weight 5 lb 7.3 oz (2475 g).   Placenta status: spontaneous, intact.  Cord:  with the following complications: none.  Anesthesia: epidural  Episiotomy: None Lacerations: 2nd degree Suture Repair: 2.0 vicryl rapide Est. Blood Loss (mL): 300    [name], BoyB Bryah [[id number]][[id number]]  At 1:07 PM a viable and healthy female was delivered via Vaginal, Spontaneous Delivery (Presentation: LOA  ).  APGAR: 8, 9; weight 5 lb 6.4 oz (2450 g).   Placenta status: spontaneous, intact.  Cord:  with the following complications: none.  Anesthesia:  epidural Episiotomy: None Lacerations: 2nd degree;Perineal Suture Repair: 2.0 vicryl rapide Est. Blood Loss (mL): 300   Mom to postpartum.   Baby A to Couplet care / Skin to Skin.   Baby B to Couplet care / Skin to Skin.  Donathan Buller J 01/20/2017, 1:36 PM

## 2016-07-31 LAB — OB RESULTS CONSOLE GC/CHLAMYDIA
CHLAMYDIA, DNA PROBE: NEGATIVE
Gonorrhea: NEGATIVE

## 2016-07-31 LAB — OB RESULTS CONSOLE HIV ANTIBODY (ROUTINE TESTING): HIV: NONREACTIVE

## 2016-07-31 LAB — OB RESULTS CONSOLE ANTIBODY SCREEN: ANTIBODY SCREEN: NEGATIVE

## 2016-07-31 LAB — OB RESULTS CONSOLE RUBELLA ANTIBODY, IGM: Rubella: IMMUNE

## 2016-07-31 LAB — OB RESULTS CONSOLE HEPATITIS B SURFACE ANTIGEN: HEP B S AG: NEGATIVE

## 2016-07-31 LAB — OB RESULTS CONSOLE RPR: RPR: NONREACTIVE

## 2016-07-31 LAB — OB RESULTS CONSOLE ABO/RH: RH Type: POSITIVE

## 2016-11-27 ENCOUNTER — Encounter: Payer: Managed Care, Other (non HMO) | Attending: Obstetrics and Gynecology | Admitting: Registered"

## 2016-11-27 ENCOUNTER — Encounter: Payer: Self-pay | Admitting: Registered"

## 2016-11-27 DIAGNOSIS — Z713 Dietary counseling and surveillance: Secondary | ICD-10-CM | POA: Insufficient documentation

## 2016-11-27 DIAGNOSIS — Z3A00 Weeks of gestation of pregnancy not specified: Secondary | ICD-10-CM | POA: Diagnosis not present

## 2016-11-27 DIAGNOSIS — R7309 Other abnormal glucose: Secondary | ICD-10-CM

## 2016-11-27 DIAGNOSIS — O24419 Gestational diabetes mellitus in pregnancy, unspecified control: Secondary | ICD-10-CM | POA: Diagnosis present

## 2016-11-27 NOTE — Progress Notes (Signed)
Patient was seen on 11/27/2016 for Gestational Diabetes self-management class at the [facility]. The following learning objectives were met by the patient during this course:   States the definition of Gestational Diabetes  States why dietary management is important in controlling blood glucose  Describes the effects each nutrient has on blood glucose levels  Demonstrates ability to create a balanced meal plan  Demonstrates carbohydrate counting   States when to check blood glucose levels  Demonstrates proper blood glucose monitoring techniques  States the effect of stress and exercise on blood glucose levels  States the importance of limiting caffeine and abstaining from alcohol and smoking  Blood glucose monitor given: [employer] Lot # [id number] x Exp: 02/14/17 Blood glucose reading: 83  Patient instructed to monitor glucose levels: FBS: 60 - <90 1 hour: <140 2 hour: <120  Patient received handouts:  Nutrition Diabetes and Pregnancy  Carbohydrate Counting List  Patient will be seen for follow-up as needed.

## 2017-01-15 LAB — OB RESULTS CONSOLE GBS: STREP GROUP B AG: POSITIVE

## 2017-01-20 ENCOUNTER — Inpatient Hospital Stay (HOSPITAL_COMMUNITY)
Admission: AD | Admit: 2017-01-20 | Discharge: 2017-01-22 | DRG: 775 | Disposition: A | Discharge status: Home / Self Care | Payer: Managed Care, Other (non HMO) | Source: Ambulatory Visit | Attending: Obstetrics and Gynecology | Admitting: Obstetrics and Gynecology

## 2017-01-20 ENCOUNTER — Inpatient Hospital Stay (HOSPITAL_COMMUNITY): Payer: Managed Care, Other (non HMO) | Admitting: Anesthesiology

## 2017-01-20 ENCOUNTER — Encounter (HOSPITAL_COMMUNITY): Payer: Self-pay | Admitting: *Deleted

## 2017-01-20 ENCOUNTER — Inpatient Hospital Stay (HOSPITAL_COMMUNITY): Payer: Managed Care, Other (non HMO)

## 2017-01-20 DIAGNOSIS — O99824 Streptococcus B carrier state complicating childbirth: Secondary | ICD-10-CM | POA: Diagnosis present

## 2017-01-20 DIAGNOSIS — Z3A35 35 weeks gestation of pregnancy: Secondary | ICD-10-CM

## 2017-01-20 DIAGNOSIS — O24420 Gestational diabetes mellitus in childbirth, diet controlled: Secondary | ICD-10-CM | POA: Diagnosis present

## 2017-01-20 DIAGNOSIS — O30043 Twin pregnancy, dichorionic/diamniotic, third trimester: Secondary | ICD-10-CM | POA: Diagnosis present

## 2017-01-20 DIAGNOSIS — O30049 Twin pregnancy, dichorionic/diamniotic, unspecified trimester: Secondary | ICD-10-CM | POA: Diagnosis present

## 2017-01-20 DIAGNOSIS — D62 Acute posthemorrhagic anemia: Secondary | ICD-10-CM | POA: Diagnosis not present

## 2017-01-20 DIAGNOSIS — O9081 Anemia of the puerperium: Secondary | ICD-10-CM | POA: Diagnosis not present

## 2017-01-20 DIAGNOSIS — O42913 Preterm premature rupture of membranes, unspecified as to length of time between rupture and onset of labor, third trimester: Secondary | ICD-10-CM

## 2017-01-20 HISTORY — DX: Gestational diabetes mellitus in pregnancy, unspecified control: O24.419

## 2017-01-20 LAB — TYPE AND SCREEN
ABO/RH(D): O POS
ANTIBODY SCREEN: NEGATIVE

## 2017-01-20 LAB — CBC
HEMATOCRIT: 32.6 % — AB (ref 36.0–46.0)
HEMOGLOBIN: 11.3 g/dL — AB (ref 12.0–15.0)
MCH: 31.7 pg (ref 26.0–34.0)
MCHC: 34.7 g/dL (ref 30.0–36.0)
MCV: 91.3 fL (ref 78.0–100.0)
Platelets: 187 10*3/uL (ref 150–400)
RBC: 3.57 MIL/uL — AB (ref 3.87–5.11)
RDW: 14.1 % (ref 11.5–15.5)
WBC: 8.5 10*3/uL (ref 4.0–10.5)

## 2017-01-20 LAB — POCT FERN TEST: POCT Fern Test: POSITIVE

## 2017-01-20 LAB — GLUCOSE, CAPILLARY: GLUCOSE-CAPILLARY: 90 mg/dL (ref 65–99)

## 2017-01-20 MED ORDER — EPHEDRINE 5 MG/ML INJ
10.0000 mg | INTRAVENOUS | Status: DC | PRN
  Filled 2017-01-20: qty 2

## 2017-01-20 MED ORDER — ZOLPIDEM TARTRATE 5 MG PO TABS: 5.0000 mg | ORAL_TABLET | Freq: Every evening | ORAL | Status: DC | PRN

## 2017-01-20 MED ORDER — OXYTOCIN 40 UNITS IN LACTATED RINGERS INFUSION - SIMPLE MED
1.0000 m[IU]/min | INTRAVENOUS | Status: DC
  Administered 2017-01-20: 2 m[IU]/min via INTRAVENOUS
  Filled 2017-01-20: qty 1000

## 2017-01-20 MED ORDER — SOD CITRATE-CITRIC ACID 500-334 MG/5ML PO SOLN: 30.0000 mL | ORAL | Status: DC | PRN

## 2017-01-20 MED ORDER — BENZOCAINE-MENTHOL 20-0.5 % EX AERO
1.0000 "application " | INHALATION_SPRAY | CUTANEOUS | Status: DC | PRN
  Administered 2017-01-20: 1 via TOPICAL
  Filled 2017-01-20 (×2): qty 56

## 2017-01-20 MED ORDER — OXYTOCIN 40 UNITS IN LACTATED RINGERS INFUSION - SIMPLE MED: 2.5000 [IU]/h | INTRAVENOUS | Status: DC

## 2017-01-20 MED ORDER — OXYTOCIN BOLUS FROM INFUSION: 500.0000 mL | Freq: Once | INTRAVENOUS | Status: DC

## 2017-01-20 MED ORDER — SENNOSIDES-DOCUSATE SODIUM 8.6-50 MG PO TABS
2.0000 | ORAL_TABLET | ORAL | Status: DC
  Administered 2017-01-21 (×2): 2 via ORAL
  Filled 2017-01-20 (×2): qty 2

## 2017-01-20 MED ORDER — PHENYLEPHRINE 40 MCG/ML (10ML) SYRINGE FOR IV PUSH (FOR BLOOD PRESSURE SUPPORT)
80.0000 ug | PREFILLED_SYRINGE | INTRAVENOUS | Status: DC | PRN
  Filled 2017-01-20: qty 5
  Filled 2017-01-20: qty 10

## 2017-01-20 MED ORDER — METHYLERGONOVINE MALEATE 0.2 MG/ML IJ SOLN: 0.2000 mg | INTRAMUSCULAR | Status: DC | PRN

## 2017-01-20 MED ORDER — TERBUTALINE SULFATE 1 MG/ML IJ SOLN
0.2500 mg | Freq: Once | INTRAMUSCULAR | Status: DC | PRN
  Filled 2017-01-20: qty 1

## 2017-01-20 MED ORDER — OXYCODONE-ACETAMINOPHEN 5-325 MG PO TABS: 1.0000 | ORAL_TABLET | ORAL | Status: DC | PRN

## 2017-01-20 MED ORDER — DIPHENHYDRAMINE HCL 50 MG/ML IJ SOLN: 12.5000 mg | INTRAMUSCULAR | Status: DC | PRN

## 2017-01-20 MED ORDER — DIBUCAINE 1 % RE OINT: 1.0000 "application " | TOPICAL_OINTMENT | RECTAL | Status: DC | PRN

## 2017-01-20 MED ORDER — ACETAMINOPHEN 325 MG PO TABS
650.0000 mg | ORAL_TABLET | ORAL | Status: DC | PRN
  Administered 2017-01-20: 650 mg via ORAL
  Filled 2017-01-20: qty 2

## 2017-01-20 MED ORDER — LACTATED RINGERS IV SOLN: INTRAVENOUS | Status: DC

## 2017-01-20 MED ORDER — DIPHENHYDRAMINE HCL 25 MG PO CAPS: 25.0000 mg | ORAL_CAPSULE | Freq: Four times a day (QID) | ORAL | Status: DC | PRN

## 2017-01-20 MED ORDER — WITCH HAZEL-GLYCERIN EX PADS: 1.0000 "application " | MEDICATED_PAD | CUTANEOUS | Status: DC | PRN

## 2017-01-20 MED ORDER — PENICILLIN G POTASSIUM 5000000 UNITS IJ SOLR
5.0000 10*6.[IU] | Freq: Once | INTRAVENOUS | Status: AC
  Administered 2017-01-20: 5 10*6.[IU] via INTRAVENOUS
  Filled 2017-01-20: qty 5

## 2017-01-20 MED ORDER — IBUPROFEN 600 MG PO TABS
600.0000 mg | ORAL_TABLET | Freq: Four times a day (QID) | ORAL | Status: DC
  Administered 2017-01-20 – 2017-01-22 (×8): 600 mg via ORAL
  Filled 2017-01-20 (×8): qty 1

## 2017-01-20 MED ORDER — LACTATED RINGERS IV SOLN: 500.0000 mL | INTRAVENOUS | Status: DC | PRN

## 2017-01-20 MED ORDER — ONDANSETRON HCL 4 MG/2ML IJ SOLN: 4.0000 mg | Freq: Four times a day (QID) | INTRAMUSCULAR | Status: DC | PRN

## 2017-01-20 MED ORDER — COCONUT OIL OIL: 1.0000 "application " | TOPICAL_OIL | Status: DC | PRN

## 2017-01-20 MED ORDER — ONDANSETRON HCL 4 MG/2ML IJ SOLN: 4.0000 mg | INTRAMUSCULAR | Status: DC | PRN

## 2017-01-20 MED ORDER — ONDANSETRON HCL 4 MG PO TABS: 4.0000 mg | ORAL_TABLET | ORAL | Status: DC | PRN

## 2017-01-20 MED ORDER — ACETAMINOPHEN 325 MG PO TABS
650.0000 mg | ORAL_TABLET | ORAL | Status: DC | PRN
  Administered 2017-01-22: 650 mg via ORAL
  Filled 2017-01-20: qty 2

## 2017-01-20 MED ORDER — OXYCODONE-ACETAMINOPHEN 5-325 MG PO TABS: 2.0000 | ORAL_TABLET | ORAL | Status: DC | PRN

## 2017-01-20 MED ORDER — FENTANYL 2.5 MCG/ML BUPIVACAINE 1/10 % EPIDURAL INFUSION (WH - ANES)
14.0000 mL/h | INTRAMUSCULAR | Status: DC | PRN
  Administered 2017-01-20: 14 mL/h via EPIDURAL
  Filled 2017-01-20: qty 100

## 2017-01-20 MED ORDER — LIDOCAINE HCL (PF) 1 % IJ SOLN
30.0000 mL | INTRAMUSCULAR | Status: DC | PRN
  Filled 2017-01-20: qty 30

## 2017-01-20 MED ORDER — LIDOCAINE HCL (PF) 1 % IJ SOLN
INTRAMUSCULAR | Status: DC | PRN
  Administered 2017-01-20: 13 mL via EPIDURAL

## 2017-01-20 MED ORDER — LACTATED RINGERS IV SOLN
500.0000 mL | Freq: Once | INTRAVENOUS | Status: AC
  Administered 2017-01-20: 500 mL via INTRAVENOUS

## 2017-01-20 MED ORDER — PRENATAL MULTIVITAMIN CH
1.0000 | ORAL_TABLET | Freq: Every day | ORAL | Status: DC
  Administered 2017-01-21: 1 via ORAL
  Filled 2017-01-20: qty 1

## 2017-01-20 MED ORDER — FLEET ENEMA 7-19 GM/118ML RE ENEM: 1.0000 | ENEMA | RECTAL | Status: DC | PRN

## 2017-01-20 MED ORDER — TETANUS-DIPHTH-ACELL PERTUSSIS 5-2.5-18.5 LF-MCG/0.5 IM SUSP: 0.5000 mL | Freq: Once | INTRAMUSCULAR | Status: DC

## 2017-01-20 MED ORDER — SIMETHICONE 80 MG PO CHEW: 80.0000 mg | CHEWABLE_TABLET | ORAL | Status: DC | PRN

## 2017-01-20 MED ORDER — METHYLERGONOVINE MALEATE 0.2 MG PO TABS: 0.2000 mg | ORAL_TABLET | ORAL | Status: DC | PRN

## 2017-01-20 MED ORDER — PENICILLIN G POT IN DEXTROSE 60000 UNIT/ML IV SOLN
3.0000 10*6.[IU] | INTRAVENOUS | Status: DC
  Filled 2017-01-20 (×4): qty 50

## 2017-01-20 NOTE — Progress Notes (Signed)
Rob Buntingarker Colantonio is a 36 y.o. G2P1001 at 2170w0d by LMP admitted for active labor, rupture of membranes  Subjective:   Objective: BP 135/89   Pulse 94   Temp 98.2 F (36.8 C) (Oral)   Resp 18   Ht 5\' 4"  (1.626 m)   Wt 65.3 kg (144 lb)   LMP 05/20/2016 (Exact Date)   SpO2 99%   BMI 24.72 kg/m  No intake/output data recorded. No intake/output data recorded.  FHT:A-  FHR: 145 bpm, variability: moderate,  accelerations:  Present,  decelerations:  Absent  B-FHR: 145 bpm, variability: moderate,  accelerations:  Present,  decelerations:  Absent UC:   regular, every 2-5 minutes SVE:   Dilation: 4 Effacement (%): 80 Station: -1 Exam by:: Erin davis rn  Labs: Lab Results  Component Value Date   WBC 8.5 01/20/2017   HGB 11.3 (L) 01/20/2017   HCT 32.6 (L) 01/20/2017   MCV 91.3 01/20/2017   PLT 187 01/20/2017    Assessment / Plan: Spontaneous labor, progressing normally  DIDI twins GDM GBS pos  Labor: Progressing normally Preeclampsia:  no signs or symptoms of toxicity Fetal Wellbeing:  Category I  X 2 Pain Control:  Epidural I/D:  n/a Anticipated MOD:  NSVD  Jackqulyn Mendel J 01/20/2017, 11:16 AM

## 2017-01-20 NOTE — Lactation Note (Addendum)
This note was copied from a baby's chart. Lactation Consultation Note  Twins 6 hours old.  35 weeks.  Baby B in NICU.  Ex BF for 21 months. Baby A recently attempted breastfeeding but was sleepy at the breast per RN. Mother pumped approx 12 ml with DEBP. Demonstrated how to perform hands on pumping.  Observed pumping and #27 flanges seem to fit appropriately. Reviewed how to finger syringe feed and baby received 12 ml. Mother states she had an oversupply of breastmilk w/ first baby. Suggest calling if supply management becomes challenging. Reviewed cleaning, milk storage and NICU booklet. Encouraged pumping q 2-2.5 hours. Provided stickers and FOB will bring 5 ml to NICU for Baby B. Plan is to breastfeed and post pump after q 2-2.5 hours. Give volume pumped baby to babies at next feeding. Mom encouraged to feed baby 8-12 times/24 hours and with feeding cues at least q 3 hours. Mom made aware of O/P services, breastfeeding support groups, community resources, and our phone # for post-discharge questions.       Patient Name: Yvonne Hurst [id number]'s Date: 01/20/2017 Reason for consult: Late-preterm 34-36.6wks;Initial assessment   Maternal Data Has patient been taught Hand Expression?: Yes Does the patient have breastfeeding experience prior to this delivery?: Yes  Feeding Feeding Type: Breast Fed (LPI feeding policy reviewed )  LATCH Score                   Interventions    Lactation Tools Discussed/Used Tools: Pump Breast pump type: Double-Electric Breast Pump Pump Review: Setup, frequency, and cleaning;Milk Storage Date initiated:: 01/20/17   Consult Status      [name], [name] 01/20/2017, 7:04 PM

## 2017-01-20 NOTE — Anesthesia Pain Management Evaluation Note (Signed)
  CRNA Pain Management Visit Note  Patient: Yvonne Hurst, 36 y.o., female  "Hello I am a member of the anesthesia team at [facility]. We have an anesthesia team available at all times to provide care throughout the hospital, including epidural management and anesthesia for C-section. I don't know your plan for the delivery whether it a natural birth, water birth, IV sedation, nitrous supplementation, doula or epidural, but we want to meet your pain goals."   1.Was your pain managed to your expectations on prior hospitalizations?   Yes   2.What is your expectation for pain management during this hospitalization?     Epidural  3.How can we help you reach that goal?   Record the patient's initial score and the patient's pain goal.   Pain: 4  Pain Goal: 7 [facility] wants you to be able to say your pain was always managed very well.  Laina Guerrieri Hristova 01/20/2017

## 2017-01-20 NOTE — Anesthesia Procedure Notes (Signed)
Epidural Patient location during procedure: OB Start time: 01/20/2017 10:53 AM End time: 01/20/2017 11:07 AM  Staffing Anesthesiologist: Anitra LauthMILLER, Eesha Schmaltz RAY Performed: anesthesiologist   Preanesthetic Checklist Completed: patient identified, site marked, surgical consent, pre-op evaluation, timeout performed, IV checked, risks and benefits discussed and monitors and equipment checked  Epidural Patient position: sitting Prep: DuraPrep Patient monitoring: heart rate, cardiac monitor, continuous pulse ox and blood pressure Approach: midline Location: L2-L3 Injection technique: LOR saline  Needle:  Needle type: Tuohy  Needle gauge: 17 G Needle length: 9 cm Needle insertion depth: 5 cm Catheter type: closed end flexible Catheter size: 20 Guage Catheter at skin depth: 8 cm Test dose: negative  Assessment Events: blood not aspirated, injection not painful, no injection resistance, negative IV test and no paresthesia  Additional Notes Reason for block:procedure for pain

## 2017-01-20 NOTE — H&P (Signed)
Yvonne Hurst is a 36 y.o. female presenting for SROM at 430 this am clear. OB History    Gravida Para Term Preterm AB Living   2 1 1     1    SAB TAB Ectopic Multiple Live Births           1     Past Medical History:  Diagnosis Date  . Anemia   . Anxiety   . Gestational diabetes   . Headache(784.0)   . Hx of varicella   . 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 Personal history of urinary calculi   . Polyhydramnios, antepartum complication   . Postpartum care following vaginal delivery (10/22/12) 10/23/2012  . SVD (spontaneous vaginal delivery) 10/23/2012   Past Surgical History:  Procedure Laterality Date  . BREAST SURGERY  2009   L breast biopsy fibroadenoma  . WISDOM TOOTH EXTRACTION     Family History: family history is not on file. Social History:  reports that she has never smoked. She has never used smokeless tobacco. She reports that she does not drink alcohol or use drugs.     Maternal Diabetes: Yes:  Diabetes Type:  Diet controlled Genetic Screening: Normal Maternal Ultrasounds/Referrals: Normal Fetal Ultrasounds or other Referrals:  None Maternal Substance Abuse:  No Significant Maternal Medications:  None Significant Maternal Lab Results:  None Other Comments:  None  Review of Systems  Constitutional: Negative.   All other systems reviewed and are negative.  Maternal Medical History:  Reason for admission: Rupture of membranes.   Contractions: Onset was 1-2 hours ago.   Frequency: rare.   Perceived severity is mild.    Fetal activity: Perceived fetal activity is normal.   Last perceived fetal movement was within the past hour.    Prenatal complications: no prenatal complications Prenatal Complications - Diabetes: gestational.      Blood pressure 123/85, pulse 89, temperature 98 F (36.7 C), temperature source Oral, resp. rate 18, last menstrual period 05/20/2016, unknown if currently  breastfeeding. Maternal Exam:  Uterine Assessment: Contraction strength is mild.  Contraction frequency is irregular.   Abdomen: Patient reports no abdominal tenderness. Fetal presentation: vertex  Introitus: Normal vulva. Normal vagina.  Ferning test: positive.  Nitrazine test: positive. Amniotic fluid character: clear.  Pelvis: adequate for delivery.   Cervix: Cervix evaluated by digital exam.     Physical Exam  Nursing note and vitals reviewed. Constitutional: She is oriented to person, place, and time. She appears well-developed and well-nourished.  HENT:  Head: Normocephalic and atraumatic.  Neck: Normal range of motion. Neck supple.  Cardiovascular: Normal rate and regular rhythm.   Respiratory: Effort normal and breath sounds normal.  GI: Soft. Bowel sounds are normal.  Genitourinary: Vagina normal and uterus normal.  Musculoskeletal: Normal range of motion.  Neurological: She is alert and oriented to person, place, and time. She has normal reflexes.  Skin: Skin is warm and dry.  Psychiatric: She has a normal mood and affect.    Prenatal labs: ABO, Rh:   Antibody:   Rubella:   RPR:    HBsAg:    HIV:    GBS:     Assessment/Plan: 35wk twin didi IUP Vertex/vertex SROM GDM- diet controlled Admit, augment prn GBS done in office 8/1 History of preterm cervical change- BMZ complete   Mattthew Ziomek J 01/20/2017, 8:27 AM

## 2017-01-20 NOTE — Anesthesia Preprocedure Evaluation (Signed)
Anesthesia Evaluation  Patient identified by MRN, date of birth, ID band Patient awake    Reviewed: Allergy & Precautions, H&P , NPO status , Patient's Chart, lab work & pertinent test results  Airway Mallampati: II  TM Distance: >3 FB Neck ROM: full    Dental no notable dental hx.    Pulmonary neg pulmonary ROS,    Pulmonary exam normal        Cardiovascular negative cardio ROS Normal cardiovascular exam     Neuro/Psych Anxiety    GI/Hepatic negative GI ROS, Neg liver ROS,   Endo/Other  negative endocrine ROSdiabetes  Renal/GU negative Renal ROS  negative genitourinary   Musculoskeletal negative musculoskeletal ROS (+)   Abdominal Normal abdominal exam  (+)   Peds negative pediatric ROS (+)  Hematology negative hematology ROS (+)   Anesthesia Other Findings   Reproductive/Obstetrics (+) Pregnancy                             Anesthesia Physical  Anesthesia Plan  ASA: II  Anesthesia Plan: Epidural   Post-op Pain Management:    Induction:   PONV Risk Score and Plan:   Airway Management Planned:   Additional Equipment:   Intra-op Plan:   Post-operative Plan:   Informed Consent: I have reviewed the patients History and Physical, chart, labs and discussed the procedure including the risks, benefits and alternatives for the proposed anesthesia with the patient or authorized representative who has indicated his/her understanding and acceptance.     Plan Discussed with:   Anesthesia Plan Comments:         Anesthesia Quick Evaluation

## 2017-01-20 NOTE — Anesthesia Postprocedure Evaluation (Signed)
Anesthesia Post Note  Patient: Rob BuntingParker Sobieski  Procedure(s) Performed: * No procedures listed *     Patient location during evaluation: Mother Baby Anesthesia Type: Epidural Level of consciousness: awake and alert and oriented Pain management: satisfactory to patient Vital Signs Assessment: post-procedure vital signs reviewed and stable Respiratory status: spontaneous breathing and nonlabored ventilation Cardiovascular status: stable Postop Assessment: no headache, no backache, no signs of nausea or vomiting, adequate PO intake and patient able to bend at knees (patient up walking) Anesthetic complications: no    Last Vitals:  Vitals:   01/20/17 1530 01/20/17 1630  BP: 126/88 130/81  Pulse: (!) 55 (!) 58  Resp: 20 20  Temp: 36.6 C (!) 36.3 C    Last Pain:  Vitals:   01/20/17 1827  TempSrc:   PainSc: 0-No pain   Pain Goal: Patients Stated Pain Goal: 5 (01/20/17 0914)               Madison HickmanGREGORY,Eberardo Demello

## 2017-01-20 NOTE — MAU Note (Signed)
Pt woke up @ 0515, felt leaking, had more with standing, clear with pink tinge.  Denies bleeding, having mild contractions.

## 2017-01-21 LAB — CBC
HEMATOCRIT: 30.5 % — AB (ref 36.0–46.0)
HEMOGLOBIN: 10.8 g/dL — AB (ref 12.0–15.0)
MCH: 31.8 pg (ref 26.0–34.0)
MCHC: 35.4 g/dL (ref 30.0–36.0)
MCV: 89.7 fL (ref 78.0–100.0)
Platelets: 178 10*3/uL (ref 150–400)
RBC: 3.4 MIL/uL — AB (ref 3.87–5.11)
RDW: 14 % (ref 11.5–15.5)
WBC: 10.5 10*3/uL (ref 4.0–10.5)

## 2017-01-21 LAB — RPR: RPR: NONREACTIVE

## 2017-01-21 NOTE — Progress Notes (Signed)
PPD #1, SVD, 2nd degree laceration, Yvonne EonDi Yvonne Twin boys Baby A: Yvonne Hurst Baby BDoroteo Hurst: Yvonne Hurst - in NICU  S:  Reports feeling better today with minimal discomfort              Tolerating po/ No nausea or vomiting / Denies dizziness or SOB             Bleeding is light             Pain controlled with Motrin             Up ad lib / ambulatory / voiding QS  Newborn breast feeding/pumping and formula supplementation - inquiring about herbal supplements to help with supply  / Circumcision - planning  Baby B in NICU - off CPAP per dad, and moved to nasal cannula, unsure of estimated LOS   O:               VS: BP 115/81   Pulse (!) 51   Temp 97.8 F (36.6 C)   Resp 18   Ht 5\' 4"  (1.626 m)   Wt 65.3 kg (144 lb)   LMP 05/20/2016 (Exact Date)   SpO2 100%   Breastfeeding? Unknown   BMI 24.72 kg/m    LABS:              Recent Labs  01/20/17 0934 01/21/17 0637  WBC 8.5 10.5  HGB 11.3* 10.8*  PLT 187 178               Blood type: --/--/O POS (08/06 0935)  Rubella: Immune (02/14 0000)                     I&O: Intake/Output      08/06 0701 - 08/07 0700 08/07 0701 - 08/08 0700   Blood 300    Total Output 300     Net -300          Urine Occurrence 1 x                  Physical Exam:             Alert and oriented X3  Lungs: Clear and unlabored  Heart: regular rate and rhythm / no mumurs  Abdomen: soft, non-tender, non-distended              Fundus: firm, non-tender, U-1  Perineum: well approximated 2nd degree laceration -healing well, no significant edema, no erythema   Lochia: appropriate, no clots   Extremities: no edema, no calf pain or tenderness    A: PPD # 1, SVD  Yvonne Eoni Yvonne Hurst  S/p PPROM with Preterm delivery   Mild ABL Anemia - stable asymptomatic    A1GDM - stable, delivered    S/p GBS positive   Doing well - stable status  P: Routine post partum orders  Pt would like to stay until Thursday due to baby in NICU, difficulty with breastfeeding/latching, preterm delivery, baby  A planning to stay until Thursday, no estimated discharge for baby B yet  Continue home raw oral FE supplement   Advised to pump every 2-3 hours and increase water intake - discussed fennel and fenugreek if needed  Anticipate discharge home Thursday     Yvonne JewsMeredith Humza Tallerico, MSN, [facility]

## 2017-01-21 NOTE — Lactation Note (Signed)
This note was copied from a baby's chart. Lactation Consultation Note  Patient Name: Yvonne AsalBoyA Joellyn River RUEAV'WToday's Date: 01/21/2017 Reason for consult: Follow-up assessment;NICU baby;Late-preterm 34-36.6wks;Multiple gestation   Follow up with mom of LPT twins. Twin B is in NICU.   Mom had [name] latched to breast when LC entered room. Infant had been feeding for about 20 minutes (10 minutes in each side) he was actively nursing. Mom then removed him and dad fed him 10 ml Alimentum. He tolerated feeding well. Mom reports cramping with BF and pumping.   Twin B is in NICU and mom has taken some EBM to infant. He is still on O2. Mom is planning to go and visit him in a few minutes.   Mom is pumping about every 3 hours after [name] feeds or if he wont BF and volume has decreased some today. Reviewed normal progression of milk coming to volume. Mom is supplementing infant with formula, although she would prefer not to she understands he needs it. We reviewed LPT infant policy hand out and feeding amounts. Mom and dad aware to increase feeds based on day of age.   Mom reports she has purchased Mother's milk tea. She asked if she needed to start it, discussed I did not think it was necessary at this time, but if it makes her feel better, she can.   Mom and dad with no other questions/concerns at this time. Enc them to keep up the good work.    Maternal Data Formula Feeding for Exclusion: No Has patient been taught Hand Expression?: Yes Does the patient have breastfeeding experience prior to this delivery?: Yes  Feeding Feeding Type: Breast Fed Nipple Type: Slow - flow Length of feed: 20 min  LATCH Score Latch: Grasps breast easily, tongue down, lips flanged, rhythmical sucking.  Audible Swallowing: A few with stimulation  Type of Nipple: Everted at rest and after stimulation  Comfort (Breast/Nipple): Soft / non-tender  Hold (Positioning): Assistance needed to correctly position infant at  breast and maintain latch.  LATCH Score: 8  Interventions    Lactation Tools Discussed/Used Tools: Bottle Breast pump type: Double-Electric Breast Pump Pump Review: Setup, frequency, and cleaning;Milk Storage Initiated by:: Reviewed and encouraged   Consult Status Consult Status: Follow-up Date: 01/22/17 Follow-up type: In-patient    Yvonne Hurst 01/21/2017, 12:45 PM

## 2017-01-22 MED ORDER — MAGNESIUM OXIDE -MG SUPPLEMENT 200 MG PO TABS: 1.0000 | ORAL_TABLET | Freq: Every day | ORAL | 4 refills | Status: DC

## 2017-01-22 MED ORDER — IBUPROFEN 600 MG PO TABS: 600.0000 mg | ORAL_TABLET | Freq: Four times a day (QID) | ORAL | 0 refills | Status: DC

## 2017-01-22 NOTE — Lactation Note (Signed)
This note was copied from a baby's chart. Lactation Consultation Note  Patient Name: Yvonne Hurst JXBJY'NToday's Date: 01/22/2017 Reason for consult: Follow-up assessment;NICU baby;Infant < 6lbs;Late-preterm 34-36.6wks;Multiple gestation   Follow up with mom of 945 hour old LPT infant. [name]- Gray- Infant with 7 BF for 10-30 minutes, 6 formula feeds of 10-23 ml, EBM x 3 of 2 cc via syringe, 5 voids and 3 stools in 24 hours preceding this assessment. Infant weight 5 lb 0.4 oz with 8% weight loss since birth. LATCH scores 8-9.   Mom reports Yvonne Hurst "[name]" is eating well. She reports he will nurse for 10-20 minutes and she is able to hear some swallows and gulps. She is aware to continue supplement of EBM/ Alimentum and to increase volumes based on day of age. Yvonne Hurst is currently in the nursery having his circumcision. Mom is aware he may be sleepy today and may not feed as well. Enc her to continue to offer feeding at least every 3 hours.   Yvonne Hurst- Twin B is in the NICU, he is eating donor milk and is increasing in volumes.   Mom is pumping after each BF and getting 4-5 ml/pumping. She is splitting the volume between the twins currently.  Mom is planning to use a friend's breast milk that was pumped for her twins when she gets home. Mom reports the twins are 727 weeks old and the mom has milk from as early as the first week of the twins lives. Mom reports she knows the mother well and is comfortable with using her EBM.   Mom without further questions/concerns at this time. Enc mom to call out for assistance as needed.   Mom     Maternal Data Formula Feeding for Exclusion: No Has patient been taught Hand Expression?: Yes Does the patient have breastfeeding experience prior to this delivery?: Yes  Feeding Feeding Type: Breast Fed Nipple Type: Slow - flow Length of feed: 20 min  LATCH Score                   Interventions    Lactation Tools Discussed/Used Tools: Bottle Breast pump  type: Double-Electric Breast Pump Pump Review: Setup, frequency, and cleaning Initiated by:: Reviewed and encouraged   Consult Status Consult Status: Follow-up Date: 01/23/17 Follow-up type: In-patient    Yvonne FloodSharon S Dwan Hemmelgarn 01/22/2017, 10:03 AM

## 2017-01-22 NOTE — Discharge Summary (Signed)
Obstetric Discharge Summary Reason for Admission: onset of labor Prenatal Procedures: ultrasound - twins Intrapartum Procedures: spontaneous vaginal delivery, GBS prophylaxis and epidural Postpartum Procedures: none Complications-Operative and Postpartum: 2nd degree perineal laceration Hemoglobin  Date Value Ref Range Status  01/21/2017 10.8 (L) 12.0 - 15.0 g/dL Final   HCT  Date Value Ref Range Status  01/21/2017 30.5 (L) 36.0 - 46.0 % Final    Physical Exam:  General: no distress Lochia: appropriate Uterine Fundus: firm Incision: healing well DVT Evaluation: No evidence of DVT seen on physical exam.  Discharge Diagnoses: Term Pregnancy-delivered - twins  Discharge Information: Date: 01/22/2017 Activity: pelvic rest Diet: routine Medications: PNV, Ibuprofen, Iron and magnesium oxide Condition: stable Instructions: refer to practice specific booklet Discharge to: home Follow-up Information    Yvonne Hurst, [name], MD. Schedule an appointment as soon as possible for a visit in 6 week(s).   Specialty:  Obstetrics and Gynecology Contact information: [address] [place] [id number] [phone]           Newborn Data:   Yvonne Hurst, BoyA Yvonne Hurst [[id number]][[id number]]  Live born female  Birth Weight: 5 lb 7.3 oz (2475 g) APGAR: 8, 9   Yvonne Hurst, BoyB Yvonne Hurst [[id number]][[id number]]  Live born female  Birth Weight: 5 lb 6.4 oz (2450 g) APGAR: 8, 9  Home with mother.  Yvonne Hurst, Yvonne Hurst 01/22/2017, 11:04 AM

## 2017-01-22 NOTE — Progress Notes (Signed)
PPD 2 SVD with 2nd degree repair / twin birth vaginal  S:  Reports feeling well - tired but better than first postpartum             Tolerating po/ No nausea or vomiting             Bleeding is light             Pain controlled with motrin             Up ad lib / ambulatory / voiding QS  Newborn breast feeding (one baby in room / one baby in NICU) / plans to room-in with babies until weekend  O:            VS: BP 121/76 (BP Location: Right Arm)   Pulse 60   Temp 98 F (36.7 C) (Oral)   Resp 18   Ht 5\' 4"  (1.626 m)   Wt 65.3 kg (144 lb)   LMP 05/20/2016 (Exact Date)   SpO2 100%   Breastfeeding? Unknown   BMI 24.72 kg/m    LABS:              Recent Labs  01/20/17 0934 01/21/17 0637  WBC 8.5 10.5  HGB 11.3* 10.8*  PLT 187 178               Blood type: --/--/O POS (08/06 0935)  Rubella: Immune (02/14 0000)                                Physical Exam:             Alert and oriented X3             Breasts - pumping / no engorgement  Abdomen: soft, non-tender, non-distended              Fundus: firm, non-tender, U-1  Lochia: light  Extremities: no edema, no calf pain or tenderness    A: PPD # 2 TWINS SVD  Doing well - stable status  P: Routine post partum order              DC - will room-in with babies per Pediatrician decision for newborn DC  Yvonne Hurst, Yvonne Hurst CNM, MSN, [facility] 01/22/2017, 9:59 AM

## 2017-01-23 ENCOUNTER — Ambulatory Visit: Payer: Self-pay

## 2017-01-23 NOTE — Lactation Note (Signed)
This note was copied from a baby's chart. Lactation Consultation Note  Patient Name: Lequita AsalBoyA Tashonna Caver ZOXWR'UToday's Date: 01/23/2017 Baby at 71 hr of life. MD order to supplement with 22cal formula. Mom has concerns about switching formula. Lactation offered the option of fortifying mom's expressed milk. RD gave RN the recipes and the powdered formula. Lactation gave RN printouts for formula ingredients. Lactation has not seen pt because RN stated mom does not want anyone else coming in room right now. Mom is tired and desires to nap. RN instructed to call for lactation after mom has rested.    Rulon Eisenmengerlizabeth E Geza Beranek 01/23/2017, 12:27 PM

## 2017-01-24 ENCOUNTER — Ambulatory Visit: Payer: Self-pay

## 2017-01-24 NOTE — Lactation Note (Addendum)
This note was copied from a baby's chart. Lactation Consultation Note  Patient Name: Yvonne Hurst ONGEX'BToday's Date: 01/24/2017 Reason for consult: Follow-up assessment;NICU baby;Infant < 6lbs;Multiple gestation;Late-preterm 34-36.6wks   Follow up with mom of 6192 hour old LPT twins. [name]- [name] is baby pt and Twin B-[name] is in NICU. Mom reports her breasts are fuller and she is getting 35-50 ml/pumping. She is pumping every 3 hours on the maintenance setting. Mom reports her breasts were starting to firm up on the outer aspects last night, she reports they soften with pumping.   Mom reports Doroteo Glassmanhelps is getting Donor milk and a little of mom 's milk. She reports he has had some bradycardic episodes and that he is now orally feeding. There is no d/c plan that she is aware of at this time.   Mom reports [name] is not wanting to BF like he was and she is still attempting at least BID. She reports she is giving him breast milk with Neosure 22 calorie powder added. He has had 2 bottle of Alimentum of 20-29 cc, EBM x 6 of 14-45 cc (one with Neosure 22 calorie powder added), 5 voids and 2 stools in the last 24 hours. Infant weight 5 lb 1.5 oz with 6.6% weight loss since birth (increase of 8 grams in the last 24 hours. Dr. Maisie Fushomas was in the room to decide if [name] can be d/c home today.   Mom without further questions/concerns at this time. LC left room while Ped was talking with mom. Engorgement Prevention/treatment reviewed. Dr. Maisie Fushomas reports infant to stay another night.    Maternal Data Formula Feeding for Exclusion: No Has patient been taught Hand Expression?: Yes Does the patient have breastfeeding experience prior to this delivery?: Yes  Feeding Feeding Type: Bottle Fed - Breast Milk Nipple Type: Slow - flow  LATCH Score                   Interventions    Lactation Tools Discussed/Used Breast pump type: Double-Electric Breast Pump Pump Review: Setup, frequency, and  cleaning Initiated by:: Reviewed and encouraged   Consult Status Consult Status: Follow-up Date: 01/25/17 Follow-up type: In-patient    Silas FloodSharon S Hice 01/24/2017, 9:34 AM

## 2017-01-25 ENCOUNTER — Ambulatory Visit: Payer: Self-pay

## 2017-01-25 NOTE — Lactation Note (Signed)
This note was copied from a baby's chart. Lactation Consultation Note  Patient Name: Yvonne AsalBoyA Elani Hurst ZOXWR'UToday's Date: 01/25/2017  Mom pumping 50-60 mls.  Baby is mostly bottle feeding with some attempts at breast.  No questions/concerns at present time.  OP lactation services reviewed and encouraged prn.   Maternal Data    Feeding    LATCH Score                   Interventions    Lactation Tools Discussed/Used     Consult Status      Huston FoleyMOULDEN, Demiyah Fischbach S 01/25/2017, 10:06 AM

## 2017-11-28 NOTE — Telephone Encounter (Signed)
Preadmission screen  

## 2018-06-02 IMAGING — RF DG HYSTEROGRAM
3 series · 3 of 3 positions shown · IV contrast (omnipaque)
Comparison: None.

CLINICAL DATA: Infertility

EXAM:
HYSTEROSALPINGOGRAM
TECHNIQUE: Following cleansing of the cervix and vagina with Betadine solution,
a hysterosalpingogram was performed using a 5-French
hysterosalpingogram catheter and Omnipaque 300 contrast. The patient
tolerated the examination without difficulty.

[Series 1: run · 1 of 1 slices shown (1 of 3)]
[im 1/1]
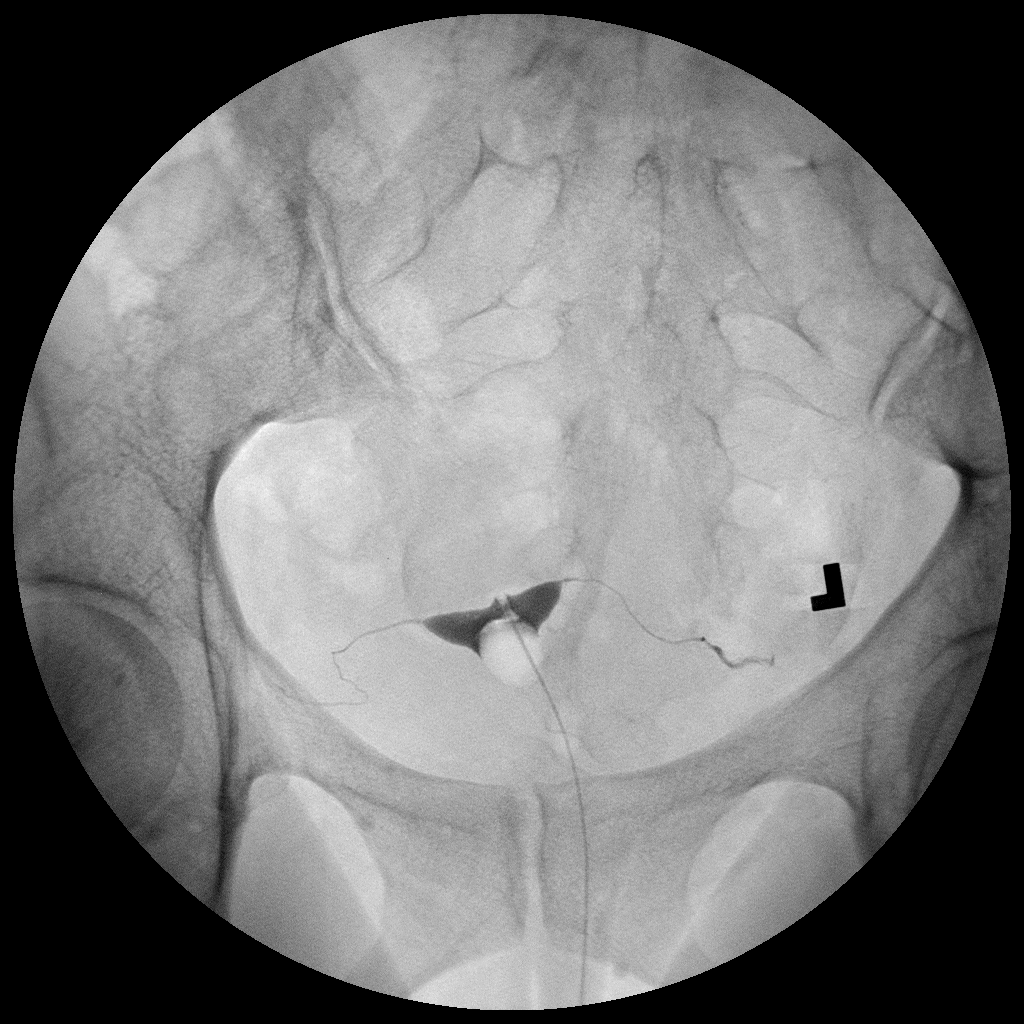

[Series 2: run · 1 of 1 slices shown (2 of 3)]
[im 1/1]
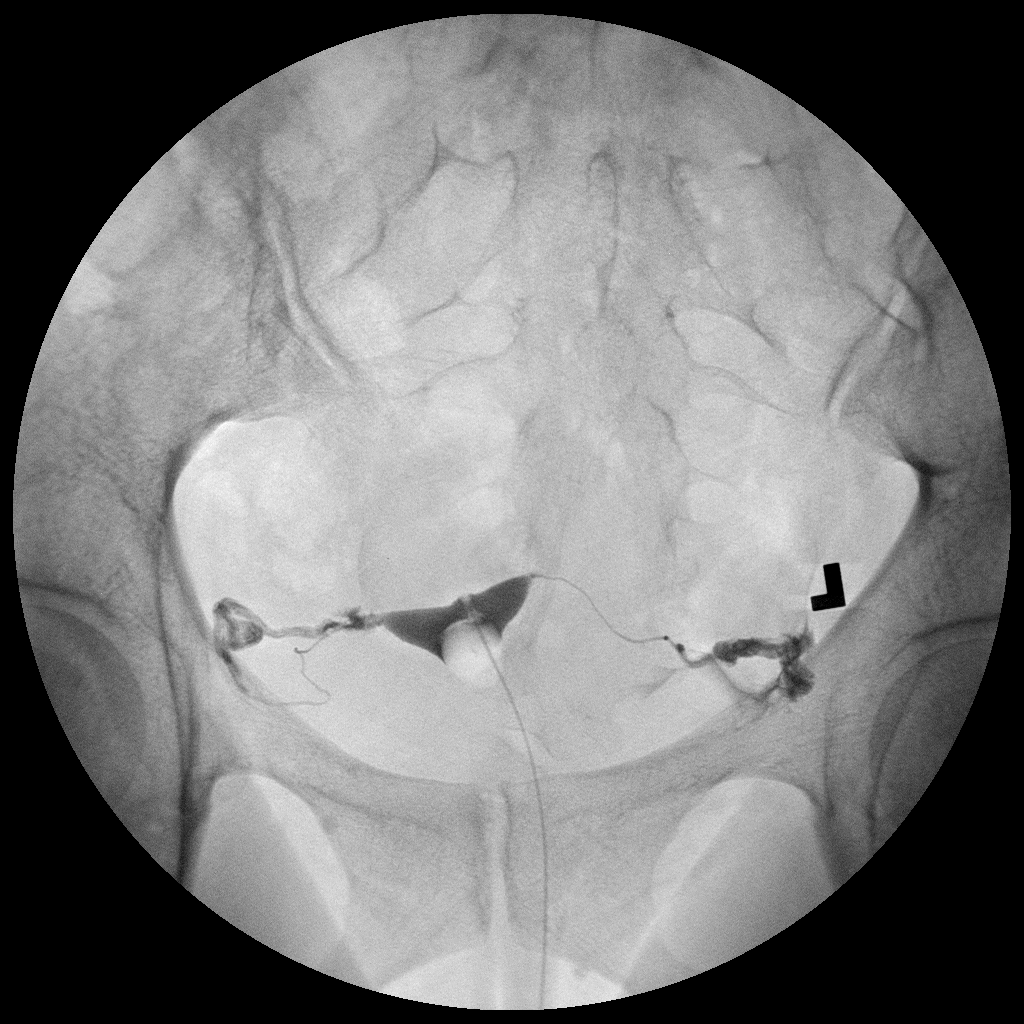

[Series 3: run · 1 of 1 slices shown (3 of 3)]
[im 1/1]
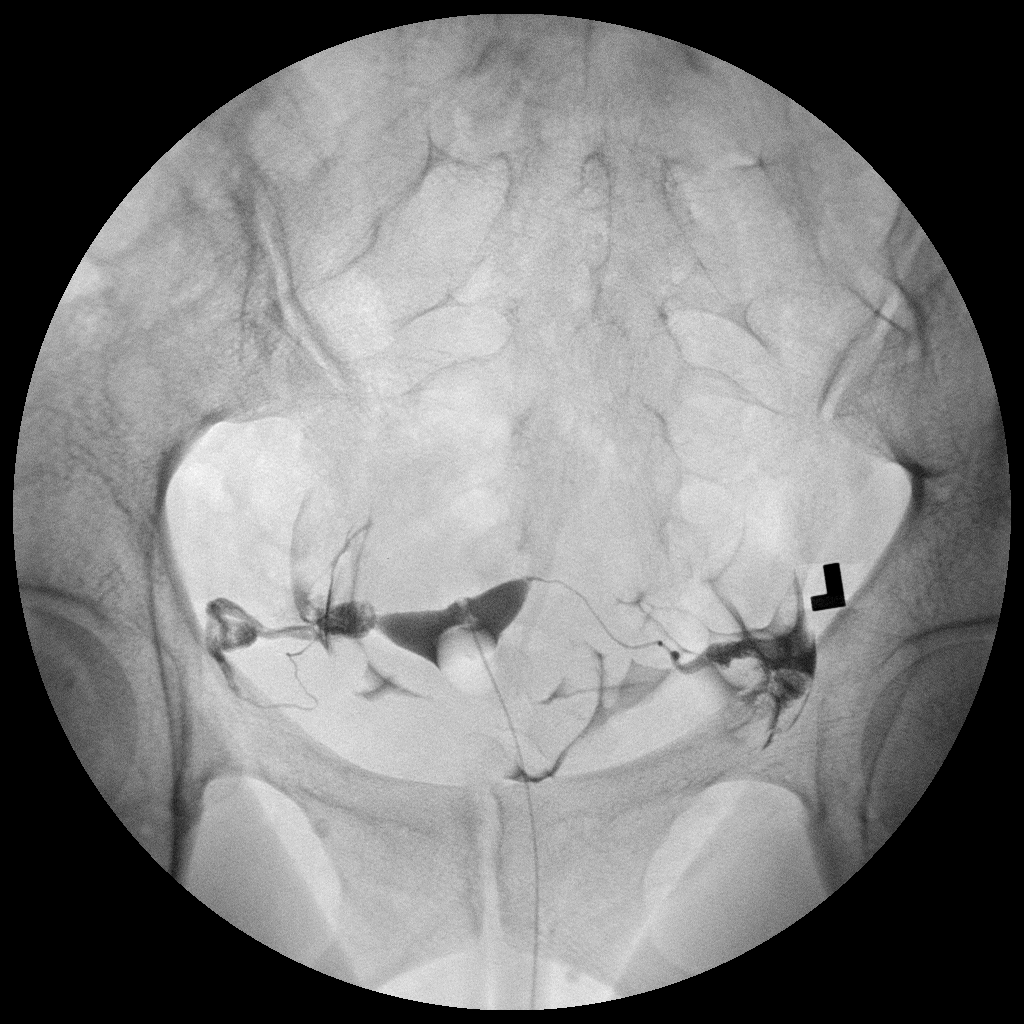

[3 of 3 positions shown; findings below may reference images not displayed]

FLUOROSCOPY TIME:  Radiation Exposure Index (as provided by the
fluoroscopic device): 24 seconds

If the device does not provide the exposure index:

Fluoroscopy Time:  dictate in minutes and seconds

Number of Acquired Images:  3
FINDINGS: Endometrial cavity is normal. Both fallopian tubes fill and have a
normal appearance. Normal spillage bilaterally.
IMPRESSION: Normal study.

## 2018-08-12 ENCOUNTER — Observation Stay (HOSPITAL_COMMUNITY)
Admission: AD | Admit: 2018-08-12 | Discharge: 2018-08-13 | Disposition: A | Discharge status: Home / Self Care | Payer: Managed Care, Other (non HMO) | Source: Ambulatory Visit | Attending: Urology | Admitting: Urology

## 2018-08-12 ENCOUNTER — Other Ambulatory Visit: Payer: Self-pay | Admitting: Internal Medicine

## 2018-08-12 ENCOUNTER — Inpatient Hospital Stay (HOSPITAL_COMMUNITY): Payer: Managed Care, Other (non HMO) | Admitting: Anesthesiology

## 2018-08-12 ENCOUNTER — Encounter (HOSPITAL_COMMUNITY)
Admission: AD | Disposition: A | Discharge status: Home / Self Care | Payer: Self-pay | Source: Ambulatory Visit | Attending: Urology

## 2018-08-12 ENCOUNTER — Encounter (HOSPITAL_COMMUNITY): Payer: Self-pay | Admitting: General Practice

## 2018-08-12 ENCOUNTER — Other Ambulatory Visit: Payer: Self-pay | Admitting: Urology

## 2018-08-12 ENCOUNTER — Inpatient Hospital Stay (HOSPITAL_COMMUNITY): Payer: Managed Care, Other (non HMO)

## 2018-08-12 DIAGNOSIS — N201 Calculus of ureter: Principal | ICD-10-CM | POA: Diagnosis present

## 2018-08-12 DIAGNOSIS — N200 Calculus of kidney: Secondary | ICD-10-CM

## 2018-08-12 DIAGNOSIS — Z87442 Personal history of urinary calculi: Secondary | ICD-10-CM | POA: Diagnosis not present

## 2018-08-12 DIAGNOSIS — N12 Tubulo-interstitial nephritis, not specified as acute or chronic: Secondary | ICD-10-CM | POA: Diagnosis present

## 2018-08-12 HISTORY — PX: CYSTOSCOPY WITH RETROGRADE PYELOGRAM, URETEROSCOPY AND STENT PLACEMENT: SHX5789

## 2018-08-12 HISTORY — DX: Personal history of urinary calculi: Z87.442

## 2018-08-12 LAB — HEMOGLOBIN: Hemoglobin: 13.8 g/dL (ref 12.0–15.0)

## 2018-08-12 LAB — PREGNANCY, URINE: Preg Test, Ur: NEGATIVE

## 2018-08-12 SURGERY — CYSTOURETEROSCOPY, WITH RETROGRADE PYELOGRAM AND STENT INSERTION
Anesthesia: General | Laterality: Right

## 2018-08-12 MED ORDER — LIDOCAINE HCL (CARDIAC) PF 100 MG/5ML IV SOSY
PREFILLED_SYRINGE | INTRAVENOUS | Status: DC | PRN
  Administered 2018-08-12: 50 mg via INTRAVENOUS

## 2018-08-12 MED ORDER — IOHEXOL 300 MG/ML  SOLN
INTRAMUSCULAR | Status: DC | PRN
  Administered 2018-08-12: 3 mL via URETHRAL

## 2018-08-12 MED ORDER — DEXTROSE-NACL 5-0.45 % IV SOLN
INTRAVENOUS | Status: DC
  Administered 2018-08-12: 20:00:00 via INTRAVENOUS

## 2018-08-12 MED ORDER — SODIUM CHLORIDE 0.9 % IV SOLN: 1.0000 g | INTRAVENOUS | Status: DC

## 2018-08-12 MED ORDER — FENTANYL CITRATE (PF) 100 MCG/2ML IJ SOLN
INTRAMUSCULAR | Status: AC
  Filled 2018-08-12: qty 2

## 2018-08-12 MED ORDER — ONDANSETRON HCL 4 MG/2ML IJ SOLN
4.0000 mg | INTRAMUSCULAR | Status: DC | PRN
  Administered 2018-08-12 – 2018-08-13 (×2): 4 mg via INTRAVENOUS
  Filled 2018-08-12 (×2): qty 2

## 2018-08-12 MED ORDER — ACETAMINOPHEN 325 MG PO TABS: 650.0000 mg | ORAL_TABLET | ORAL | Status: DC | PRN

## 2018-08-12 MED ORDER — KETOROLAC TROMETHAMINE 30 MG/ML IJ SOLN
30.0000 mg | Freq: Once | INTRAMUSCULAR | Status: AC | PRN
  Administered 2018-08-12: 30 mg via INTRAVENOUS

## 2018-08-12 MED ORDER — DEXAMETHASONE SODIUM PHOSPHATE 10 MG/ML IJ SOLN
INTRAMUSCULAR | Status: DC | PRN
  Administered 2018-08-12: 10 mg via INTRAVENOUS

## 2018-08-12 MED ORDER — SODIUM CHLORIDE 0.9 % IV SOLN
2.0000 g | Freq: Once | INTRAVENOUS | Status: AC
  Administered 2018-08-12: 2 g via INTRAVENOUS
  Filled 2018-08-12: qty 20

## 2018-08-12 MED ORDER — MIDAZOLAM HCL 2 MG/2ML IJ SOLN
INTRAMUSCULAR | Status: AC
  Filled 2018-08-12: qty 2

## 2018-08-12 MED ORDER — HYDROMORPHONE HCL 1 MG/ML IJ SOLN
INTRAMUSCULAR | Status: AC
  Filled 2018-08-12: qty 1

## 2018-08-12 MED ORDER — SENNA 8.6 MG PO TABS
1.0000 | ORAL_TABLET | Freq: Two times a day (BID) | ORAL | Status: DC
  Administered 2018-08-12 – 2018-08-13 (×2): 8.6 mg via ORAL
  Filled 2018-08-12 (×2): qty 1

## 2018-08-12 MED ORDER — ONDANSETRON HCL 4 MG/2ML IJ SOLN
INTRAMUSCULAR | Status: DC | PRN
  Administered 2018-08-12: 4 mg via INTRAVENOUS

## 2018-08-12 MED ORDER — TAMSULOSIN HCL 0.4 MG PO CAPS
0.4000 mg | ORAL_CAPSULE | Freq: Every day | ORAL | Status: DC
  Administered 2018-08-12: 0.4 mg via ORAL
  Filled 2018-08-12: qty 1

## 2018-08-12 MED ORDER — HYDROMORPHONE HCL 1 MG/ML IJ SOLN
0.2500 mg | INTRAMUSCULAR | Status: DC | PRN
  Administered 2018-08-12 (×2): 0.5 mg via INTRAVENOUS

## 2018-08-12 MED ORDER — MIDAZOLAM HCL 2 MG/2ML IJ SOLN
INTRAMUSCULAR | Status: DC | PRN
  Administered 2018-08-12: 0.5 mg via INTRAVENOUS

## 2018-08-12 MED ORDER — FENTANYL CITRATE (PF) 100 MCG/2ML IJ SOLN
INTRAMUSCULAR | Status: DC | PRN
  Administered 2018-08-12: 50 ug via INTRAVENOUS

## 2018-08-12 MED ORDER — PHENYLEPHRINE 40 MCG/ML (10ML) SYRINGE FOR IV PUSH (FOR BLOOD PRESSURE SUPPORT)
PREFILLED_SYRINGE | INTRAVENOUS | Status: AC
  Filled 2018-08-12: qty 10

## 2018-08-12 MED ORDER — OXYCODONE HCL 5 MG/5ML PO SOLN: 5.0000 mg | Freq: Once | ORAL | Status: DC | PRN

## 2018-08-12 MED ORDER — LACTATED RINGERS IV SOLN
INTRAVENOUS | Status: DC
  Administered 2018-08-12: 17:00:00 via INTRAVENOUS

## 2018-08-12 MED ORDER — PROPOFOL 10 MG/ML IV BOLUS
INTRAVENOUS | Status: AC
  Filled 2018-08-12: qty 20

## 2018-08-12 MED ORDER — KETOROLAC TROMETHAMINE 30 MG/ML IJ SOLN
INTRAMUSCULAR | Status: AC
  Administered 2018-08-12: 30 mg via INTRAVENOUS
  Filled 2018-08-12: qty 1

## 2018-08-12 MED ORDER — PROMETHAZINE HCL 25 MG/ML IJ SOLN: 6.2500 mg | INTRAMUSCULAR | Status: DC | PRN

## 2018-08-12 MED ORDER — ALPRAZOLAM 0.5 MG PO TABS
0.5000 mg | ORAL_TABLET | Freq: Every day | ORAL | Status: DC | PRN
  Administered 2018-08-12: 0.5 mg via ORAL
  Filled 2018-08-12: qty 1

## 2018-08-12 MED ORDER — OXYCODONE HCL 5 MG PO TABS: 5.0000 mg | ORAL_TABLET | Freq: Once | ORAL | Status: DC | PRN

## 2018-08-12 MED ORDER — PROPOFOL 10 MG/ML IV BOLUS
INTRAVENOUS | Status: DC | PRN
  Administered 2018-08-12: 200 mg via INTRAVENOUS

## 2018-08-12 MED ORDER — SODIUM CHLORIDE 0.9 % IR SOLN
Status: DC | PRN
  Administered 2018-08-12: 3000 mL via INTRAVESICAL

## 2018-08-12 MED ORDER — OXYCODONE HCL 5 MG PO TABS
5.0000 mg | ORAL_TABLET | ORAL | Status: DC | PRN
  Administered 2018-08-12: 5 mg via ORAL
  Filled 2018-08-12: qty 1

## 2018-08-12 SURGICAL SUPPLY — 20 items
BAG URO CATCHER STRL LF (MISCELLANEOUS) ×3 IMPLANT
BASKET ZERO TIP NITINOL 2.4FR (BASKET) IMPLANT
CATH INTERMIT  6FR 70CM (CATHETERS) IMPLANT
CLOTH BEACON ORANGE TIMEOUT ST (SAFETY) ×3 IMPLANT
COVER WAND RF STERILE (DRAPES) IMPLANT
FIBER LASER FLEXIVA 365 (UROLOGICAL SUPPLIES) IMPLANT
FIBER LASER TRAC TIP (UROLOGICAL SUPPLIES) IMPLANT
GLOVE BIOGEL M STRL SZ7.5 (GLOVE) ×3 IMPLANT
GOWN STRL REUS W/TWL LRG LVL3 (GOWN DISPOSABLE) ×6 IMPLANT
GUIDEWIRE ANG ZIPWIRE 038X150 (WIRE) IMPLANT
GUIDEWIRE STR DUAL SENSOR (WIRE) ×3 IMPLANT
IV NS 1000ML (IV SOLUTION) ×2
IV NS 1000ML BAXH (IV SOLUTION) ×1 IMPLANT
MANIFOLD NEPTUNE II (INSTRUMENTS) ×3 IMPLANT
PACK CYSTO (CUSTOM PROCEDURE TRAY) ×3 IMPLANT
SHEATH URETERAL 12FRX35CM (MISCELLANEOUS) IMPLANT
STENT URET 6FRX24 CONTOUR (STENTS) ×3 IMPLANT
TUBING CONNECTING 10 (TUBING) ×2 IMPLANT
TUBING CONNECTING 10' (TUBING) ×1
TUBING UROLOGY SET (TUBING) ×3 IMPLANT

## 2018-08-12 NOTE — H&P (View-Only) (Signed)
Office Visit Report     08/12/2018   --------------------------------------------------------------------------------   [name]  MRN: [id number]  PRIMARY CARE:  Georgann Housekeeper, MD  DOB: 03-Oct-1980, 38 year old Female  REFERRING:    SSN: [id number]  PROVIDER:  Bjorn Pippin, M.D.    TREATING:  Vianne Bulls    LOCATION:  [facility] [phone]   --------------------------------------------------------------------------------   CC: I have kidney stones.  HPI: Yvonne Hurst is a 38 year-old female established patient who is here for renal calculi.  08/12/18: Patient with the below noted history. She presents today with complaints of fever and gross hematuria. She was seen by her PCP yesterday and UA was positive for hematuria, but was not concerning for infectious process per her report. Today, she continues to complain of hematuria. She did develop some right flank discomfort earlier today, through this is mild and not similar to past stone events. She does have some suprapubic tenderness. She has noted increased frequency, but has been pushing fluids. No dysuria. She did have fever or 101.3 this morning. She used Tylenol and this improved. Her last dose of Tylenol was approximatly 10:30-11 am. She is currently afebrile.   08/18/17: Yvonne Hurst is a 38 yo WF with history of stones. She had some pain and urgency about 2 weeks ago and thinks she passed a small stone. Yesterday she had a sharp urethral pain and passed a stone which she brought to the office today. Her prior stone was Calcium Oxalate. She had twins back in August and has been eating a lot of chocolate and spinach. She I symptom free today and her UA is clear. I found a CT from 2009 and she had bilateral renal stones. She thinks she may pass small stones frequently. She has no associated signs or symptoms.   This is not her first kidney stone. She is not currently having flank pain, back pain, groin pain, nausea, vomiting, fever  or chills.     ALLERGIES: sulfa    MEDICATIONS: Zyrtec     GU PSH: None   NON-GU PSH: None   GU PMH: Renal calculus, She passed a right ureteral stone but has residual renal stones bilaterally. I will send her stone for analysis and discussed diet and hydration. She will return in a year for a KUB. - 08/18/2017    NON-GU PMH: None   FAMILY HISTORY: kidney stone - Grandmother   SOCIAL HISTORY: Marital Status: Married Preferred Language: English; Race: White Current Smoking Status: Patient has never smoked.   Tobacco Use Assessment Completed: Used Tobacco in last 30 days? Drinks 7 drinks per week.  Does not drink caffeine. 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 Patient denies burning /pain with urination, leakage of urine, stream starts and stops, trouble starting your stream, have to strain to urinate, and being pregnant.  Gastrointestinal (Upper):   Patient reports nausea. Patient denies vomiting and indigestion/ heartburn.  Gastrointestinal (Lower):   Patient denies diarrhea and constipation.  Constitutional:   Patient reports fever. Patient denies night sweats, weight loss, and fatigue.  Skin:   Patient denies skin rash/ lesion and itching.  Eyes:   Patient denies blurred vision and double vision.  Ears/ Nose/ Throat:   Patient denies sore throat and sinus problems.  Hematologic/Lymphatic:   Patient  denies swollen glands and easy bruising.  Cardiovascular:   Patient denies leg swelling and chest pains.  Respiratory:   Patient denies cough and shortness of breath.  Endocrine:   Patient denies excessive thirst.  Musculoskeletal:   Patient denies back pain and joint pain.  Neurological:   Patient denies headaches and dizziness.  Psychologic:   Patient denies depression and anxiety.   Notes: pt states she has  flank pain on both sides. 3/10 on pain scale. Patient also states she has upper abdominal pain under rib s.    VITAL SIGNS:      08/12/2018 01:50 PM  Weight 121 lb / 54.88 kg  Height 64 in / 162.56 cm  BP 113/75 mmHg  Heart Rate 107 /min  Temperature 98.4 F / 36.8 C  BMI 20.8 kg/m   MULTI-SYSTEM PHYSICAL EXAMINATION:    Constitutional: Well-nourished. No physical deformities. Normally developed. Good grooming.  Respiratory: Normal breath sounds. No labored breathing, no use of accessory muscles.   Cardiovascular: Regular rate and rhythm. No murmur, no gallop. Normal temperature, normal extremity pulses, no swelling, no varicosities.   Neurologic / Psychiatric: Oriented to time, oriented to place, oriented to person. No depression, no anxiety, no agitation.  Gastrointestinal: No mass, no tenderness, no rigidity, non obese abdomen. Mild right CVAT, mild suprapubic tenderness.   Musculoskeletal: Normal gait and station of head and neck.     PAST DATA REVIEWED:  Source Of History:  Patient  Records Review:   Previous Patient Records  Urine Test Review:   Urinalysis  X-Ray Review: C.T. Stone Protocol: Reviewed Films. Reviewed Report.     PROCEDURES:         C.T. Urogram - [id number]      Patient confirmed No Neulasta OnPro Device.           Urinalysis w/Scope Dipstick Dipstick Cont'd Micro  Color: Amber Bilirubin: Neg mg/dL WBC/hpf: 0 - 5/hpf  Appearance: Cloudy Ketones: Neg mg/dL RBC/hpf: >60/hpf  Specific Gravity: 1.020 Blood: 3+ ery/uL Bacteria: Few (10-25/hpf)  pH: 6.5 Protein: Trace mg/dL Cystals: Amorph Phosphates  Glucose: Neg mg/dL Urobilinogen: 0.2 mg/dL Casts: NS (Not Seen)    Nitrites: Neg Trichomonas: Not Present    Leukocyte Esterase: Trace leu/uL Mucous: Present      Epithelial Cells: 0 - 5/hpf      Yeast: NS (Not Seen)      Sperm: Not Present    ASSESSMENT:      ICD-10 Details  1 GU:   Renal calculus - N20.0    PLAN:           Orders Labs Urine Culture   X-Rays: C.T. Stone Protocol Without Contrast          Schedule         Document Letter(s):  Created for Patient: Clinical Summary         Notes:   I will send urine for culture today.    CT imaging revealed an obstructing proximal ureteral stone.  Considering her fever to 101 this morning, I have recommended that she proceed with cystoscopy and right ureteral stent placement.  She will then be admitted for IV antibiotics.  Her culture is pending from our office.  We reviewed the potential risks, complications, and expected recovery process associated with this procedure.  Informed consent has been obtained.    

## 2018-08-12 NOTE — Transfer of Care (Signed)
Immediate Anesthesia Transfer of Care Note  Patient: Yvonne Hurst  Procedure(s) Performed: CYSTOSCOPY WITH RETROGRADE PYELOGRAM, URETEROSCOPY AND STENT PLACEMENT (Right )  Patient Location: PACU  Anesthesia Type:General  Level of Consciousness: awake, alert , oriented and patient cooperative  Airway & Oxygen Therapy: Patient Spontanous Breathing  Post-op Assessment: Report given to RN and Post -op Vital signs reviewed and stable  Post vital signs: Reviewed and stable  Last Vitals:  Vitals Value Taken Time  BP 124/89 08/12/2018  6:01 PM  Temp    Pulse 107 08/12/2018  6:03 PM  Resp 22 08/12/2018  6:05 PM  SpO2 99 % 08/12/2018  6:03 PM  Vitals shown include unvalidated device data.  Last Pain:  Vitals:   08/12/18 1651  TempSrc:   PainSc: 4          Complications: No apparent anesthesia complications

## 2018-08-12 NOTE — Anesthesia Procedure Notes (Signed)
Procedure Name: LMA Insertion Date/Time: 08/12/2018 5:32 PM Performed by: Wilder Glade, CRNA Pre-anesthesia Checklist: Patient identified, Emergency Drugs available, Suction available, Patient being monitored and Timeout performed Patient Re-evaluated:Patient Re-evaluated prior to induction Oxygen Delivery Method: Circle system utilized Preoxygenation: Pre-oxygenation with 100% oxygen Induction Type: IV induction LMA: LMA inserted LMA Size: 3.0 Number of attempts: 1 (brief atraumatic) ETT to lip (cm): yes. Tube secured with: Tape Dental Injury: Teeth and Oropharynx as per pre-operative assessment

## 2018-08-12 NOTE — Anesthesia Preprocedure Evaluation (Addendum)
Anesthesia Evaluation  Patient identified by MRN, date of birth, ID band Patient awake    Reviewed: Allergy & Precautions, NPO status , Patient's Chart, lab work & pertinent test results  Airway Mallampati: I  TM Distance: >3 FB Neck ROM: Full    Dental no notable dental hx.    Pulmonary neg pulmonary ROS,    Pulmonary exam normal breath sounds clear to auscultation       Cardiovascular negative cardio ROS Normal cardiovascular exam Rhythm:Regular Rate:Normal     Neuro/Psych  Headaches, Anxiety    GI/Hepatic negative GI ROS, Neg liver ROS,   Endo/Other  diabetes, Gestational  Renal/GU negative Renal ROS     Musculoskeletal negative musculoskeletal ROS (+)   Abdominal   Peds  Hematology negative hematology ROS (+)   Anesthesia Other Findings Right ureteral stone  Reproductive/Obstetrics                           Anesthesia Physical Anesthesia Plan  ASA: II  Anesthesia Plan: General   Post-op Pain Management:    Induction: Intravenous  PONV Risk Score and Plan: 3 and Ondansetron, Dexamethasone, Midazolam and Treatment may vary due to age or medical condition  Airway Management Planned: Oral ETT and LMA  Additional Equipment:   Intra-op Plan:   Post-operative Plan: Extubation in OR  Informed Consent: I have reviewed the patients History and Physical, chart, labs and discussed the procedure including the risks, benefits and alternatives for the proposed anesthesia with the patient or authorized representative who has indicated his/her understanding and acceptance.     Dental advisory given  Plan Discussed with: CRNA  Anesthesia Plan Comments:        Anesthesia Quick Evaluation

## 2018-08-12 NOTE — Op Note (Signed)
Preoperative diagnosis:  1. Right ureteral calculus 2. Febrile UTI   Postoperative diagnosis:  1. Right renal calculus 2. Febrile UTI   Procedure:  1. Cystoscopy 2. Right ureteral stent placement (6 x 24 - no string) 3. Right retrograde pyelography with interpretation  Surgeon: Moody Bruins. M.D.  Anesthesia: General  Complications: None  Intraoperative findings: Right retrograde pyelography was performed with omnipaque contrast and a 6 Fr ureteral catheter.  No filing defects were noted in the renal collecting system.  There was a proximal ureteral filling defect c/w the patient's known stone.  EBL: Minimal  Specimens: Right renal pelvic culture  Indication: Yvonne Hurst is a 38 y.o. patient with a right ureteral stone and fever. After reviewing the management options for treatment, he elected to proceed with the above surgical procedure(s). We have discussed the potential benefits and risks of the procedure, side effects of the proposed treatment, the likelihood of the patient achieving the goals of the procedure, and any potential problems that might occur during the procedure or recuperation. Informed consent has been obtained.  Description of procedure:  The patient was taken to the operating room and general anesthesia was induced.  The patient was placed in the dorsal lithotomy position, prepped and draped in the usual sterile fashion, and preoperative antibiotics were administered. A preoperative time-out was performed.   Cystourethroscopy was performed.  The patient's urethra was examined and was normal. The bladder was then systematically examined in its entirety. There was no evidence for any bladder tumors, stones, or other mucosal pathology.    Attention then turned to the right ureteral orifice and a ureteral catheter was used to intubate the ureteral orifice. A wire was placed and a 6 Fr ureteral catheter was advanced over the wire above the level of  obstruction.  A urine culture was obtained.  Omnipaque contrast was injected through the ureteral catheter and a retrograde pyelogram was performed with findings as dictated above.  A 0.38 sensor guidewire was then advanced up the right ureter into the renal pelvis under fluoroscopic guidance.  The wire was then backloaded through the cystoscope and a ureteral stent was advance over the wire using Seldinger technique.  The stent was positioned appropriately under fluoroscopic and cystoscopic guidance.  The wire was then removed with an adequate stent curl noted in the renal pelvis as well as in the bladder.  The bladder was then emptied and the procedure ended.  The patient appeared to tolerate the procedure well and without complications.  The patient was able to be awakened and transferred to the recovery unit in satisfactory condition.    Moody Bruins MD

## 2018-08-12 NOTE — Anesthesia Postprocedure Evaluation (Signed)
Anesthesia Post Note  Patient: [name]  Procedure(s) Performed: CYSTOSCOPY WITH RETROGRADE PYELOGRAM, URETEROSCOPY AND STENT PLACEMENT (Right )     Patient location during evaluation: PACU Anesthesia Type: General Level of consciousness: awake and alert Pain management: pain level controlled Vital Signs Assessment: post-procedure vital signs reviewed and stable Respiratory status: spontaneous breathing, nonlabored ventilation, respiratory function stable and patient connected to nasal cannula oxygen Cardiovascular status: blood pressure returned to baseline and stable Postop Assessment: no apparent nausea or vomiting Anesthetic complications: no    Last Vitals:  Vitals:   08/12/18 1900 08/12/18 1915  BP: (!) 105/94   Pulse: 87 81  Resp: 20 (!) 23  Temp: 37.3 C   SpO2: 100% 97%    Last Pain:  Vitals:   08/12/18 1915  TempSrc:   PainSc: 3                  Ryan P Ellender

## 2018-08-12 NOTE — H&P (Signed)
Office Visit Report     08/12/2018   --------------------------------------------------------------------------------   Yvonne Hurst  MRN: [id number]  PRIMARY CARE:  Georgann Housekeeper, MD  DOB: 03-Oct-1980, 38 year old Female  REFERRING:    SSN: [id number]  PROVIDER:  Bjorn Pippin, M.D.    TREATING:  Vianne Bulls    LOCATION:  [facility] [phone]   --------------------------------------------------------------------------------   CC: I have kidney stones.  HPI: Yvonne Hurst is a 38 year-old female established patient who is here for renal calculi.  08/12/18: Patient with the below noted history. She presents today with complaints of fever and gross hematuria. She was seen by her PCP yesterday and UA was positive for hematuria, but was not concerning for infectious process per her report. Today, she continues to complain of hematuria. She did develop some right flank discomfort earlier today, through this is mild and not similar to past stone events. She does have some suprapubic tenderness. She has noted increased frequency, but has been pushing fluids. No dysuria. She did have fever or 101.3 this morning. She used Tylenol and this improved. Her last dose of Tylenol was approximatly 10:30-11 am. She is currently afebrile.   08/18/17: Yvonne Hurst is a 38 yo WF with history of stones. She had some pain and urgency about 2 weeks ago and thinks she passed a small stone. Yesterday she had a sharp urethral pain and passed a stone which she brought to the office today. Her prior stone was Calcium Oxalate. She had twins back in August and has been eating a lot of chocolate and spinach. She I symptom free today and her UA is clear. I found a CT from 2009 and she had bilateral renal stones. She thinks she may pass small stones frequently. She has no associated signs or symptoms.   This is not her first kidney stone. She is not currently having flank pain, back pain, groin pain, nausea, vomiting, fever  or chills.     ALLERGIES: sulfa    MEDICATIONS: Zyrtec     GU PSH: None   NON-GU PSH: None   GU PMH: Renal calculus, She passed a right ureteral stone but has residual renal stones bilaterally. I will send her stone for analysis and discussed diet and hydration. She will return in a year for a KUB. - 08/18/2017    NON-GU PMH: None   FAMILY HISTORY: kidney stone - Grandmother   SOCIAL HISTORY: Marital Status: Married Preferred Language: English; Race: White Current Smoking Status: Patient has never smoked.   Tobacco Use Assessment Completed: Used Tobacco in last 30 days? Drinks 7 drinks per week.  Does not drink caffeine. 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 Patient denies burning /pain with urination, leakage of urine, stream starts and stops, trouble starting your stream, have to strain to urinate, and being pregnant.  Gastrointestinal (Upper):   Patient reports nausea. Patient denies vomiting and indigestion/ heartburn.  Gastrointestinal (Lower):   Patient denies diarrhea and constipation.  Constitutional:   Patient reports fever. Patient denies night sweats, weight loss, and fatigue.  Skin:   Patient denies skin rash/ lesion and itching.  Eyes:   Patient denies blurred vision and double vision.  Ears/ Nose/ Throat:   Patient denies sore throat and sinus problems.  Hematologic/Lymphatic:   Patient  denies swollen glands and easy bruising.  Cardiovascular:   Patient denies leg swelling and chest pains.  Respiratory:   Patient denies cough and shortness of breath.  Endocrine:   Patient denies excessive thirst.  Musculoskeletal:   Patient denies back pain and joint pain.  Neurological:   Patient denies headaches and dizziness.  Psychologic:   Patient denies depression and anxiety.   Notes: pt states she has  flank pain on both sides. 3/10 on pain scale. Patient also states she has upper abdominal pain under rib s.    VITAL SIGNS:      08/12/2018 01:50 PM  Weight 121 lb / 54.88 kg  Height 64 in / 162.56 cm  BP 113/75 mmHg  Heart Rate 107 /min  Temperature 98.4 F / 36.8 C  BMI 20.8 kg/m   MULTI-SYSTEM PHYSICAL EXAMINATION:    Constitutional: Well-nourished. No physical deformities. Normally developed. Good grooming.  Respiratory: Normal breath sounds. No labored breathing, no use of accessory muscles.   Cardiovascular: Regular rate and rhythm. No murmur, no gallop. Normal temperature, normal extremity pulses, no swelling, no varicosities.   Neurologic / Psychiatric: Oriented to time, oriented to place, oriented to person. No depression, no anxiety, no agitation.  Gastrointestinal: No mass, no tenderness, no rigidity, non obese abdomen. Mild right CVAT, mild suprapubic tenderness.   Musculoskeletal: Normal gait and station of head and neck.     PAST DATA REVIEWED:  Source Of History:  Patient  Records Review:   Previous Patient Records  Urine Test Review:   Urinalysis  X-Ray Review: C.T. Stone Protocol: Reviewed Films. Reviewed Report.     PROCEDURES:         C.T. Urogram - [id number]      Patient confirmed No Neulasta OnPro Device.           Urinalysis w/Scope Dipstick Dipstick Cont'd Micro  Color: Amber Bilirubin: Neg mg/dL WBC/hpf: 0 - 5/hpf  Appearance: Cloudy Ketones: Neg mg/dL RBC/hpf: >49/SWH  Specific Gravity: 1.020 Blood: 3+ ery/uL Bacteria: Few (10-25/hpf)  pH: 6.5 Protein: Trace mg/dL Cystals: Amorph Phosphates  Glucose: Neg mg/dL Urobilinogen: 0.2 mg/dL Casts: NS (Not Seen)    Nitrites: Neg Trichomonas: Not Present    Leukocyte Esterase: Trace leu/uL Mucous: Present      Epithelial Cells: 0 - 5/hpf      Yeast: NS (Not Seen)      Sperm: Not Present    ASSESSMENT:      ICD-10 Details  1 GU:   Renal calculus - N20.0    PLAN:           Orders Labs Urine Culture   X-Rays: C.T. Stone Protocol Without Contrast          Schedule         Document Letter(s):  Created for Patient: Clinical Summary         Notes:   I will send urine for culture today.    CT imaging revealed an obstructing proximal ureteral stone.  Considering her fever to 101 this morning, I have recommended that she proceed with cystoscopy and right ureteral stent placement.  She will then be admitted for IV antibiotics.  Her culture is pending from our office.  We reviewed the potential risks, complications, and expected recovery process associated with this procedure.  Informed consent has been obtained.

## 2018-08-13 ENCOUNTER — Other Ambulatory Visit: Payer: Self-pay

## 2018-08-13 ENCOUNTER — Other Ambulatory Visit: Payer: Self-pay | Admitting: Urology

## 2018-08-13 ENCOUNTER — Encounter (HOSPITAL_COMMUNITY): Payer: Self-pay | Admitting: Urology

## 2018-08-13 DIAGNOSIS — N201 Calculus of ureter: Secondary | ICD-10-CM | POA: Diagnosis not present

## 2018-08-13 DIAGNOSIS — N12 Tubulo-interstitial nephritis, not specified as acute or chronic: Secondary | ICD-10-CM | POA: Diagnosis present

## 2018-08-13 LAB — CBC
HCT: 40.8 % (ref 36.0–46.0)
Hemoglobin: 12.9 g/dL (ref 12.0–15.0)
MCH: 29.8 pg (ref 26.0–34.0)
MCHC: 31.6 g/dL (ref 30.0–36.0)
MCV: 94.2 fL (ref 80.0–100.0)
Platelets: 238 10*3/uL (ref 150–400)
RBC: 4.33 MIL/uL (ref 3.87–5.11)
RDW: 12.2 % (ref 11.5–15.5)
WBC: 3.8 10*3/uL — ABNORMAL LOW (ref 4.0–10.5)
nRBC: 0 % (ref 0.0–0.2)

## 2018-08-13 LAB — BASIC METABOLIC PANEL
Anion gap: 7 (ref 5–15)
BUN: 12 mg/dL (ref 6–20)
CHLORIDE: 104 mmol/L (ref 98–111)
CO2: 25 mmol/L (ref 22–32)
Calcium: 8.6 mg/dL — ABNORMAL LOW (ref 8.9–10.3)
Creatinine, Ser: 0.58 mg/dL (ref 0.44–1.00)
GFR calc Af Amer: >60 mL/min (ref >60)
GFR calc non Af Amer: >60 mL/min (ref >60)
Glucose, Bld: 164 mg/dL — ABNORMAL HIGH (ref 70–99)
POTASSIUM: 4.6 mmol/L (ref 3.5–5.1)
Sodium: 136 mmol/L (ref 135–145)

## 2018-08-13 MED ORDER — KETOROLAC TROMETHAMINE 15 MG/ML IJ SOLN
15.0000 mg | Freq: Once | INTRAMUSCULAR | Status: AC
  Administered 2018-08-13: 15 mg via INTRAVENOUS
  Filled 2018-08-13: qty 1

## 2018-08-13 MED ORDER — HYDROCODONE-ACETAMINOPHEN 5-325 MG PO TABS: 1.0000 | ORAL_TABLET | Freq: Four times a day (QID) | ORAL | 0 refills | Status: DC | PRN

## 2018-08-13 MED ORDER — KETOROLAC TROMETHAMINE 10 MG PO TABS: 10.0000 mg | ORAL_TABLET | Freq: Four times a day (QID) | ORAL | 0 refills | Status: DC | PRN

## 2018-08-13 MED ORDER — CEFDINIR 300 MG PO CAPS: 300.0000 mg | ORAL_CAPSULE | Freq: Two times a day (BID) | ORAL | 0 refills | Status: DC

## 2018-08-13 MED ORDER — PHENAZOPYRIDINE HCL 200 MG PO TABS: 200.0000 mg | ORAL_TABLET | Freq: Three times a day (TID) | ORAL | 1 refills | Status: DC | PRN

## 2018-08-13 MED ORDER — ONDANSETRON HCL 4 MG PO TABS: 4.0000 mg | ORAL_TABLET | Freq: Four times a day (QID) | ORAL | 1 refills | Status: AC | PRN

## 2018-08-13 MED ORDER — SILODOSIN 8 MG PO CAPS: 8.0000 mg | ORAL_CAPSULE | Freq: Every day | ORAL | 1 refills | Status: DC

## 2018-08-13 MED ORDER — KETOROLAC TROMETHAMINE 30 MG/ML IJ SOLN
30.0000 mg | Freq: Once | INTRAMUSCULAR | Status: AC
  Administered 2018-08-13: 30 mg via INTRAVENOUS
  Filled 2018-08-13: qty 1

## 2018-08-13 NOTE — Discharge Instructions (Signed)
Ureteral Stent Implantation, Care After °Refer to this sheet in the next few weeks. These instructions provide you with information about caring for yourself after your procedure. Your health care provider may also give you more specific instructions. Your treatment has been planned according to current medical practices, but problems sometimes occur. Call your health care provider if you have any problems or questions after your procedure. °What can I expect after the procedure? °After the procedure, it is common to have: °· Nausea. °· Mild pain when you urinate. You may feel this pain in your lower back or lower abdomen. Pain should stop within a few minutes after you urinate. This may last for up to 1 week. °· A small amount of blood in your urine for several days. °Follow these instructions at home: ° °Medicines °· Take over-the-counter and prescription medicines only as told by your health care provider. °· If you were prescribed an antibiotic medicine, take it as told by your health care provider. Do not stop taking the antibiotic even if you start to feel better. °· Do not drive for 24 hours if you received a sedative. °· Do not drive or operate heavy machinery while taking prescription pain medicines. °Activity °· Return to your normal activities as told by your health care provider. Ask your health care provider what activities are safe for you. °· Do not lift anything that is heavier than 10 lb (4.5 kg). Follow this limit for 1 week after your procedure, or for as long as told by your health care provider. °General instructions °· Watch for any blood in your urine. Call your health care provider if the amount of blood in your urine increases. °· If you have a catheter: °? Follow instructions from your health care provider about taking care of your catheter and collection bag. °? Do not take baths, swim, or use a hot tub until your health care provider approves. °· Drink enough fluid to keep your urine  clear or pale yellow. °· Keep all follow-up visits as told by your health care provider. This is important. °Contact a health care provider if: °· You have pain that gets worse or does not get better with medicine, especially pain when you urinate. °· You have difficulty urinating. °· You feel nauseous or you vomit repeatedly during a period of more than 2 days after the procedure. °Get help right away if: °· Your urine is dark red or has blood clots in it. °· You are leaking urine (have incontinence). °· The end of the stent comes out of your urethra. °· You cannot urinate. °· You have sudden, sharp, or severe pain in your abdomen or lower back. °· You have a fever. °This information is not intended to replace advice given to you by your health care provider. Make sure you discuss any questions you have with your health care provider. °Document Released: 02/03/2013 Document Revised: 11/09/2015 Document Reviewed: 12/16/2014 °Elsevier Interactive Patient Education © 2019 [employer] ° °

## 2018-08-13 NOTE — Discharge Summary (Signed)
Physician Discharge Summary  Patient ID: Yvonne Hurst MRN: [id number] DOB/AGE: 36/25/1982 36 y.o.  Admit date: 08/12/2018 Discharge date: 08/13/2018  Admission Diagnoses:  Right ureteral stone  Discharge Diagnoses:  Principal Problem:   Right ureteral stone Active Problems:   Pyelonephritis   Past Medical History:  Diagnosis Date  . Anemia   . Anxiety   . Gestational diabetes   . Headache(784.0)   . History of kidney stones   . Hx of varicella   . 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 Personal history of urinary calculi   . Polyhydramnios, antepartum complication   . Postpartum care following vaginal delivery (10/22/12) 10/23/2012  . SVD (spontaneous vaginal delivery) 10/23/2012    Surgeries: Procedure(s): CYSTOSCOPY WITH RETROGRADE PYELOGRAM, URETEROSCOPY AND STENT PLACEMENT on 08/12/2018   Consultants (if any):   Discharged Condition: Improved  Hospital Course: Yvonne Hurst is an 38 y.o. female who was admitted 08/12/2018 with a diagnosis of Right ureteral stone with pyelonephritis and went to the operating room on 08/12/2018 and underwent the above named procedures.   She did well post op and was afebrile on POD#1.  She has some stent pain but is otherwise well.  Urine culture is pending.  She will be discharged on Cefdinir pending the culture.    She was given perioperative antibiotics:  Anti-infectives (From admission, onward)   Start     Dose/Rate Route Frequency Ordered Stop   08/13/18 1800  cefTRIAXone (ROCEPHIN) 1 g in sodium chloride 0.9 % 100 mL IVPB     1 g 200 mL/hr over 30 Minutes Intravenous Every 24 hours 08/12/18 1950     08/13/18 0000  cefdinir (OMNICEF) 300 MG capsule     300 mg Oral 2 times daily 08/13/18 0949     08/12/18 1630  cefTRIAXone (ROCEPHIN) 2 g in sodium chloride 0.9 % 100 mL IVPB     2 g 200 mL/hr over 30 Minutes Intravenous  Once 08/12/18 1623 08/12/18 1751    .  She was given sequential  compression devices and early ambulationfor DVT prophylaxis.  She benefited maximally from the hospital stay and there were no complications.    Recent vital signs:  Vitals:   08/13/18 0544 08/13/18 0951  BP: 102/67 111/74  Pulse: (!) 55 (!) 57  Resp: 20 14  Temp: 97.9 F (36.6 C) 98.2 F (36.8 C)  SpO2: 97% 100%    Recent laboratory studies:  Lab Results  Component Value Date   HGB 12.9 08/13/2018   HGB 13.8 08/12/2018   HGB 10.8 (L) 01/21/2017   Lab Results  Component Value Date   WBC 3.8 (L) 08/13/2018   PLT 238 08/13/2018   No results found for: INR Lab Results  Component Value Date   NA 136 08/13/2018   K 4.6 08/13/2018   CL 104 08/13/2018   CO2 25 08/13/2018   BUN 12 08/13/2018   CREATININE 0.58 08/13/2018   GLUCOSE 164 (H) 08/13/2018    Discharge Medications:   Allergies as of 08/13/2018      Reactions   Sulfa Antibiotics Other (See Comments)   Reaction:  Flu-like symptoms       Medication List    STOP taking these medications   ibuprofen 600 MG tablet Commonly known as:  ADVIL,MOTRIN   Magnesium Oxide 200 MG Tabs     TAKE these medications   acetaminophen 500 MG tablet Commonly known as:  TYLENOL Take 1,000 mg by mouth daily  as needed for moderate pain.   cefdinir 300 MG capsule Commonly known as:  OMNICEF Take 1 capsule (300 mg total) by mouth 2 (two) times daily.   cetirizine 10 MG tablet Commonly known as:  ZYRTEC Take 10 mg by mouth daily.   HYDROcodone-acetaminophen 5-325 MG tablet Commonly known as:  NORCO Take 1 tablet by mouth every 6 (six) hours as needed for severe pain.   ketorolac 10 MG tablet Commonly known as:  TORADOL Take 1 tablet (10 mg total) by mouth every 6 (six) hours as needed for moderate pain.   phenazopyridine 200 MG tablet Commonly known as:  PYRIDIUM Take 1 tablet (200 mg total) by mouth 3 (three) times daily as needed for pain.   phenylephrine 10 MG Tabs tablet Commonly known as:  SUDAFED PE Take 10  mg by mouth daily as needed (nasal congestion).   silodosin 8 MG Caps capsule Commonly known as:  RAPAFLO Take 1 capsule (8 mg total) by mouth daily with breakfast.       Diagnostic Studies: Dg C-arm 1-60 Min-no Report  Result Date: 08/12/2018 Fluoroscopy was utilized by the requesting physician.  No radiographic interpretation.    Disposition: Discharge disposition: 01-Home or Self Care       Discharge Instructions    Discontinue IV   Complete by:  As directed       Follow-up Information    Bjorn Pippin, MD.   Specialty:  Urology Why:  Office will call to arrange next procedure.  Contact information: [address] [place] [address] [phone]            Signed: Bjorn Pippin 08/13/2018, 9:53 AM

## 2018-08-14 LAB — URINE CULTURE: Culture: NO GROWTH

## 2018-08-19 ENCOUNTER — Other Ambulatory Visit: Payer: Self-pay

## 2018-08-19 ENCOUNTER — Encounter (HOSPITAL_BASED_OUTPATIENT_CLINIC_OR_DEPARTMENT_OTHER): Payer: Self-pay | Admitting: *Deleted

## 2018-08-19 NOTE — Progress Notes (Signed)
Spoke with Yocheved Npo after midnight, arrive 530 am 08-20-18 [facility] Medications to take sip of water: xanax prn, toradol prn Labs on chart/epic: cbc, bmet 08-13-18, urine pregnancy 08-12-18  No labs needed Has surgery orders in epic Driver spouse matt

## 2018-08-20 ENCOUNTER — Ambulatory Visit (HOSPITAL_BASED_OUTPATIENT_CLINIC_OR_DEPARTMENT_OTHER)
Admission: RE | Admit: 2018-08-20 | Discharge: 2018-08-20 | Disposition: A | Discharge status: Home / Self Care | Payer: Managed Care, Other (non HMO) | Attending: Urology | Admitting: Urology

## 2018-08-20 ENCOUNTER — Encounter (HOSPITAL_BASED_OUTPATIENT_CLINIC_OR_DEPARTMENT_OTHER): Payer: Self-pay | Admitting: *Deleted

## 2018-08-20 ENCOUNTER — Encounter (HOSPITAL_BASED_OUTPATIENT_CLINIC_OR_DEPARTMENT_OTHER)
Admission: RE | Disposition: A | Discharge status: Home / Self Care | Payer: Self-pay | Source: Home / Self Care | Attending: Urology

## 2018-08-20 ENCOUNTER — Ambulatory Visit (HOSPITAL_BASED_OUTPATIENT_CLINIC_OR_DEPARTMENT_OTHER): Payer: Managed Care, Other (non HMO) | Admitting: Anesthesiology

## 2018-08-20 ENCOUNTER — Other Ambulatory Visit: Payer: Self-pay

## 2018-08-20 DIAGNOSIS — N202 Calculus of kidney with calculus of ureter: Secondary | ICD-10-CM | POA: Insufficient documentation

## 2018-08-20 DIAGNOSIS — F419 Anxiety disorder, unspecified: Secondary | ICD-10-CM | POA: Diagnosis not present

## 2018-08-20 DIAGNOSIS — Z882 Allergy status to sulfonamides status: Secondary | ICD-10-CM | POA: Insufficient documentation

## 2018-08-20 DIAGNOSIS — Z79899 Other long term (current) drug therapy: Secondary | ICD-10-CM | POA: Insufficient documentation

## 2018-08-20 DIAGNOSIS — N201 Calculus of ureter: Secondary | ICD-10-CM

## 2018-08-20 HISTORY — DX: Adverse effect of unspecified anesthetic, initial encounter: T41.45XA

## 2018-08-20 HISTORY — DX: Unspecified convulsions: R56.9

## 2018-08-20 HISTORY — PX: CYSTOSCOPY/URETEROSCOPY/HOLMIUM LASER/STENT PLACEMENT: SHX6546

## 2018-08-20 HISTORY — DX: Other specified postprocedural states: R11.2

## 2018-08-20 HISTORY — DX: Other specified postprocedural states: Z98.890

## 2018-08-20 HISTORY — DX: Other complications of anesthesia, initial encounter: T88.59XA

## 2018-08-20 LAB — POCT PREGNANCY, URINE: Preg Test, Ur: NEGATIVE

## 2018-08-20 SURGERY — CYSTOSCOPY/URETEROSCOPY/HOLMIUM LASER/STENT PLACEMENT
Anesthesia: General | Site: Ureter | Laterality: Right

## 2018-08-20 MED ORDER — LIDOCAINE 2% (20 MG/ML) 5 ML SYRINGE
INTRAMUSCULAR | Status: DC | PRN
  Administered 2018-08-20: 50 mg via INTRAVENOUS

## 2018-08-20 MED ORDER — FENTANYL CITRATE (PF) 100 MCG/2ML IJ SOLN
INTRAMUSCULAR | Status: AC
  Filled 2018-08-20: qty 2

## 2018-08-20 MED ORDER — SCOPOLAMINE 1 MG/3DAYS TD PT72
MEDICATED_PATCH | TRANSDERMAL | Status: AC
  Filled 2018-08-20: qty 1

## 2018-08-20 MED ORDER — KETOROLAC TROMETHAMINE 30 MG/ML IJ SOLN
INTRAMUSCULAR | Status: AC
  Filled 2018-08-20: qty 1

## 2018-08-20 MED ORDER — PROMETHAZINE HCL 25 MG/ML IJ SOLN
6.2500 mg | INTRAMUSCULAR | Status: DC | PRN
  Filled 2018-08-20: qty 1

## 2018-08-20 MED ORDER — KETOROLAC TROMETHAMINE 30 MG/ML IJ SOLN
INTRAMUSCULAR | Status: DC | PRN
  Administered 2018-08-20: 30 mg via INTRAVENOUS

## 2018-08-20 MED ORDER — FENTANYL CITRATE (PF) 100 MCG/2ML IJ SOLN
25.0000 ug | INTRAMUSCULAR | Status: DC | PRN
  Administered 2018-08-20: 25 ug via INTRAVENOUS
  Filled 2018-08-20: qty 1

## 2018-08-20 MED ORDER — SODIUM CHLORIDE 0.9 % IR SOLN
Status: DC | PRN
  Administered 2018-08-20: 3000 mL

## 2018-08-20 MED ORDER — ARTIFICIAL TEARS OPHTHALMIC OINT
TOPICAL_OINTMENT | OPHTHALMIC | Status: AC
  Filled 2018-08-20: qty 3.5

## 2018-08-20 MED ORDER — PROPOFOL 10 MG/ML IV BOLUS
INTRAVENOUS | Status: AC
  Filled 2018-08-20: qty 20

## 2018-08-20 MED ORDER — DEXAMETHASONE SODIUM PHOSPHATE 4 MG/ML IJ SOLN
INTRAMUSCULAR | Status: DC | PRN
  Administered 2018-08-20: 8 mg via INTRAVENOUS

## 2018-08-20 MED ORDER — ONDANSETRON HCL 4 MG/2ML IJ SOLN
INTRAMUSCULAR | Status: DC | PRN
  Administered 2018-08-20: 4 mg via INTRAVENOUS

## 2018-08-20 MED ORDER — FENTANYL CITRATE (PF) 100 MCG/2ML IJ SOLN
INTRAMUSCULAR | Status: DC | PRN
  Administered 2018-08-20: 50 ug via INTRAVENOUS

## 2018-08-20 MED ORDER — IOHEXOL 300 MG/ML  SOLN
INTRAMUSCULAR | Status: DC | PRN
  Administered 2018-08-20: 1 mL via URETHRAL

## 2018-08-20 MED ORDER — SODIUM CHLORIDE 0.9 % IV SOLN
INTRAVENOUS | Status: DC
  Administered 2018-08-20: 06:00:00 via INTRAVENOUS
  Filled 2018-08-20: qty 1000

## 2018-08-20 MED ORDER — ONDANSETRON HCL 4 MG/2ML IJ SOLN
INTRAMUSCULAR | Status: AC
  Filled 2018-08-20: qty 2

## 2018-08-20 MED ORDER — LIDOCAINE 2% (20 MG/ML) 5 ML SYRINGE
INTRAMUSCULAR | Status: AC
  Filled 2018-08-20: qty 5

## 2018-08-20 MED ORDER — SODIUM CHLORIDE 0.9 % IV SOLN
INTRAVENOUS | Status: AC
  Filled 2018-08-20: qty 100

## 2018-08-20 MED ORDER — SODIUM CHLORIDE 0.9 % IV SOLN
1.0000 g | INTRAVENOUS | Status: AC
  Administered 2018-08-20: 2 g via INTRAVENOUS
  Filled 2018-08-20: qty 10

## 2018-08-20 MED ORDER — DEXAMETHASONE SODIUM PHOSPHATE 10 MG/ML IJ SOLN
INTRAMUSCULAR | Status: AC
  Filled 2018-08-20: qty 1

## 2018-08-20 MED ORDER — KETOROLAC TROMETHAMINE 30 MG/ML IJ SOLN
30.0000 mg | Freq: Once | INTRAMUSCULAR | Status: DC | PRN
  Filled 2018-08-20: qty 1

## 2018-08-20 MED ORDER — MIDAZOLAM HCL 2 MG/2ML IJ SOLN
INTRAMUSCULAR | Status: AC
  Filled 2018-08-20: qty 2

## 2018-08-20 MED ORDER — CEFTRIAXONE SODIUM 1 G IJ SOLR
INTRAMUSCULAR | Status: AC
  Filled 2018-08-20: qty 10

## 2018-08-20 MED ORDER — PROPOFOL 10 MG/ML IV BOLUS
INTRAVENOUS | Status: DC | PRN
  Administered 2018-08-20: 150 mg via INTRAVENOUS

## 2018-08-20 MED ORDER — MIDAZOLAM HCL 5 MG/5ML IJ SOLN
INTRAMUSCULAR | Status: DC | PRN
  Administered 2018-08-20: 1 mg via INTRAVENOUS

## 2018-08-20 SURGICAL SUPPLY — 29 items
BAG DRAIN URO-CYSTO SKYTR STRL (DRAIN) ×3 IMPLANT
BASKET STONE 1.7 NGAGE (UROLOGICAL SUPPLIES) ×3 IMPLANT
BASKET ZERO TIP NITINOL 2.4FR (BASKET) IMPLANT
CATH URET 5FR 28IN CONE TIP (BALLOONS)
CATH URET 5FR 28IN OPEN ENDED (CATHETERS) ×3 IMPLANT
CATH URET 5FR 70CM CONE TIP (BALLOONS) IMPLANT
CLOTH BEACON ORANGE TIMEOUT ST (SAFETY) ×3 IMPLANT
ELECT REM PT RETURN 9FT ADLT (ELECTROSURGICAL)
ELECTRODE REM PT RTRN 9FT ADLT (ELECTROSURGICAL) IMPLANT
FIBER LASER FLEXIVA 365 (UROLOGICAL SUPPLIES) IMPLANT
FIBER LASER TRAC TIP (UROLOGICAL SUPPLIES) IMPLANT
GLOVE BIOGEL PI IND STRL 6.5 (GLOVE) ×1 IMPLANT
GLOVE BIOGEL PI INDICATOR 6.5 (GLOVE) ×2
GLOVE ECLIPSE 6.5 STRL STRAW (GLOVE) ×3 IMPLANT
GLOVE SURG SS PI 8.0 STRL IVOR (GLOVE) ×3 IMPLANT
GOWN STRL REUS W/TWL LRG LVL3 (GOWN DISPOSABLE) ×3 IMPLANT
GOWN STRL REUS W/TWL XL LVL3 (GOWN DISPOSABLE) ×3 IMPLANT
GUIDEWIRE ANG ZIPWIRE 038X150 (WIRE) IMPLANT
GUIDEWIRE STR DUAL SENSOR (WIRE) ×3 IMPLANT
IV NS IRRIG 3000ML ARTHROMATIC (IV SOLUTION) ×3 IMPLANT
KIT TURNOVER CYSTO (KITS) ×3 IMPLANT
MANIFOLD NEPTUNE II (INSTRUMENTS) ×3 IMPLANT
NS IRRIG 500ML POUR BTL (IV SOLUTION) ×3 IMPLANT
PACK CYSTO (CUSTOM PROCEDURE TRAY) ×3 IMPLANT
SHEATH URETERAL 12FRX35CM (MISCELLANEOUS) ×3 IMPLANT
STENT URET 6FRX24 CONTOUR (STENTS) ×3 IMPLANT
TUBE CONNECTING 12'X1/4 (SUCTIONS) ×1
TUBE CONNECTING 12X1/4 (SUCTIONS) ×2 IMPLANT
TUBING UROLOGY SET (TUBING) ×3 IMPLANT

## 2018-08-20 NOTE — Interval H&P Note (Signed)
History and Physical Interval Note:  Yvonne Hurst had a stent placed a week ago and has requested her procedure be moved up because of stent discomfort.    08/20/2018 7:15 AM  [name] [name]  has presented today for surgery, with the diagnosis of RIGHT URETERAL STONE  The various methods of treatment have been discussed with the patient and family. After consideration of risks, benefits and other options for treatment, the patient has consented to  Procedure(s): RIGHT URETEROSCOPY/HOLMIUM LASER/STENT EXCHANGE (Right) as a surgical intervention .  The patient's history has been reviewed, patient examined, no change in status, stable for surgery.  I have reviewed the patient's chart and labs.  Questions were answered to the patient's satisfaction.     Bjorn Pippin

## 2018-08-20 NOTE — Anesthesia Preprocedure Evaluation (Signed)
Anesthesia Evaluation  Patient identified by MRN, date of birth, ID band Patient awake    Reviewed: Allergy & Precautions, NPO status , Patient's Chart, lab work & pertinent test results  History of Anesthesia Complications (+) PONV  Airway Mallampati: II  TM Distance: >3 FB Neck ROM: Full    Dental no notable dental hx.    Pulmonary neg pulmonary ROS,    Pulmonary exam normal breath sounds clear to auscultation       Cardiovascular negative cardio ROS Normal cardiovascular exam Rhythm:Regular Rate:Normal     Neuro/Psych Anxiety negative neurological ROS     GI/Hepatic negative GI ROS, Neg liver ROS,   Endo/Other  negative endocrine ROS  Renal/GU negative Renal ROS  negative genitourinary   Musculoskeletal negative musculoskeletal ROS (+)   Abdominal   Peds negative pediatric ROS (+)  Hematology negative hematology ROS (+)   Anesthesia Other Findings   Reproductive/Obstetrics negative OB ROS                             Anesthesia Physical Anesthesia Plan  ASA: II  Anesthesia Plan: General   Post-op Pain Management:    Induction: Intravenous  PONV Risk Score and Plan: 4 or greater and Ondansetron, Dexamethasone, Treatment may vary due to age or medical condition and Scopolamine patch - Pre-op  Airway Management Planned: LMA  Additional Equipment:   Intra-op Plan:   Post-operative Plan: Extubation in OR  Informed Consent: I have reviewed the patients History and Physical, chart, labs and discussed the procedure including the risks, benefits and alternatives for the proposed anesthesia with the patient or authorized representative who has indicated his/her understanding and acceptance.     Dental advisory given  Plan Discussed with: CRNA and Surgeon  Anesthesia Plan Comments:         Anesthesia Quick Evaluation

## 2018-08-20 NOTE — Addendum Note (Signed)
Addendum  created 08/20/18 1008 by Norva Pavlov, CRNA   Intraprocedure Meds edited

## 2018-08-20 NOTE — Anesthesia Postprocedure Evaluation (Signed)
Anesthesia Post Note  Patient: Yvonne Hurst  Procedure(s) Performed: RIGHT URETEROSCOPY/STENT EXCHANGE (Right Ureter)     Patient location during evaluation: PACU Anesthesia Type: General Level of consciousness: awake and alert Pain management: pain level controlled Vital Signs Assessment: post-procedure vital signs reviewed and stable Respiratory status: spontaneous breathing, nonlabored ventilation, respiratory function stable and patient connected to nasal cannula oxygen Cardiovascular status: blood pressure returned to baseline and stable Postop Assessment: no apparent nausea or vomiting Anesthetic complications: no    Last Vitals:  Vitals:   08/20/18 0546 08/20/18 0819  BP: 120/82 (!) 135/94  Pulse: (!) 51   Resp: 16 12  Temp: 36.4 C (!) 36.4 C  SpO2: 100%     Last Pain:  Vitals:   08/20/18 0819  TempSrc:   PainSc: 0-No pain                 Jerzi Tigert S

## 2018-08-20 NOTE — Op Note (Signed)
Procedure: 1.  Cystoscopy with removal of right double-J stent. 2.  Right ureteroscopic stone extraction. 3.  Insertion of right double-J stent.  Preop diagnosis: Right proximal ureteral stone and renal stones.  Postop diagnosis: Same.  Surgeon: Dr. Bjorn Pippin.  Anesthesia: General.  Specimen: Stones.  Drain: 6 [place] by 24 cm right contour double-J stent with tether.  EBL: Minimal.  Complications: None.  Indications: The patient is a 38 year old female who initially had placement of a right double-J stent for a 3.8 mm right proximal ureteral stone with obstruction and fever.  She also has nephrocalcinosis with multiple small renal stones.  She returns today for stone removal.  Procedure: She was given 2 g of Rocephin.  She had been on preoperative antibiotics as well.  A general anesthetic was induced.  She was placed in the lithotomy position and fitted with PAS hose.  Her perineum and genitalia were prepped with Betadine solution and she was draped in usual sterile fashion.  Cystoscopy was performed using the 23 [place] scope and 30 degree lens.  Examination revealed mild stent irritation at the right ureteral orifice but was otherwise normal.  The stent was grasped and pulled to the urethral meatus.  A sensor guidewire was passed to the kidney and the old stent was removed.  A 12 French 35 cm digital access sheath inner core was easily passed over the wire to the level of the kidney.  This was followed by the assembled 12/14 sheath.  The inner core and wire were then removed.  The dual-lumen digital ureteroscope was passed through the sheath to the kidney.  The target stone was identified in the lower calyx and was removed using an engage basket and approximately 2 fragments.  Several small stones were identified in multiple calyces and were felt to be sufficiently large to require removal.  These were removed with the engage basket and proved to be 1-2 mm stones.  Once all significant  stones were removed, the ureteroscope and sheath were backed out while visually inspecting the ureter.  In the distal ureter there was some mild mucosal inflammatory changes and there appeared to be some bleeding from the kidney from the instrumentation and it was felt the replacement of the stent was indicated.  The cystoscope was then reinserted and a sensor guidewire was passed to the kidney.  A new 6 French by 24 cm contour double-J stent was passed the kidney under fluoroscopic guidance.  The wire was removed and a good coil in the kidney and a good coil in the bladder.  The bladder was drained and the cystoscope was removed leaving the stent string exiting the urethra.  The string was knotted close to the meatus, trimmed and tucked vaginally.  She was taken down from lithotomy position, her anesthetic was reversed and she was moved recovery in stable condition.  There were no complications.

## 2018-08-20 NOTE — Discharge Instructions (Addendum)
Ureteral Stent Implantation, Care After Refer to this sheet in the next few weeks. These instructions provide you with information about caring for yourself after your procedure. Your health care provider may also give you more specific instructions. Your treatment has been planned according to current medical practices, but problems sometimes occur. Call your health care provider if you have any problems or questions after your procedure. What can I expect after the procedure? After the procedure, it is common to have:  Nausea.  Mild pain when you urinate. You may feel this pain in your lower back or lower abdomen. Pain should stop within a few minutes after you urinate. This may last for up to 1 week.  A small amount of blood in your urine for several days. Follow these instructions at home:  Medicines  Take over-the-counter and prescription medicines only as told by your health care provider.  If you were prescribed an antibiotic medicine, take it as told by your health care provider. Do not stop taking the antibiotic even if you start to feel better.  Do not drive for 24 hours if you received a sedative.  Do not drive or operate heavy machinery while taking prescription pain medicines. Activity  Return to your normal activities as told by your health care provider. Ask your health care provider what activities are safe for you.  Do not lift anything that is heavier than 10 lb (4.5 kg). Follow this limit for 1 week after your procedure, or for as long as told by your health care provider. General instructions  Watch for any blood in your urine. Call your health care provider if the amount of blood in your urine increases.  If you have a catheter: ? Follow instructions from your health care provider about taking care of your catheter and collection bag. ? Do not take baths, swim, or use a hot tub until your health care provider approves.  Drink enough fluid to keep your urine  clear or pale yellow.  Keep all follow-up visits as told by your health care provider. This is important. Contact a health care provider if:  You have pain that gets worse or does not get better with medicine, especially pain when you urinate.  You have difficulty urinating.  You feel nauseous or you vomit repeatedly during a period of more than 2 days after the procedure. Get help right away if:  Your urine is dark red or has blood clots in it.  You are leaking urine (have incontinence).  The end of the stent comes out of your urethra.  You cannot urinate.  You have sudden, sharp, or severe pain in your abdomen or lower back.  You have a fever.  You may remove the stent with the string on Monday morning.    This information is not intended to replace advice given to you by your health care provider. Make sure you discuss any questions you have with your health care provider. Document Released: 02/03/2013 Document Revised: 11/09/2015 Document Reviewed: 12/16/2014 Elsevier Interactive Patient Education  2019 [employer].  Post Anesthesia Home Care Instructions  Activity: Get plenty of rest for the remainder of the day. A responsible individual must stay with you for 24 hours following the procedure.  For the next 24 hours, DO NOT: -Drive a car -Advertising copywriter -Drink alcoholic beverages -Take any medication unless instructed by your physician -Make any legal decisions or sign important papers.  Meals: Start with liquid foods such as gelatin or soup.  Progress to regular foods as tolerated. Avoid greasy, spicy, heavy foods. If nausea and/or vomiting occur, drink only clear liquids until the nausea and/or vomiting subsides. Call your physician if vomiting continues.  Special Instructions/Symptoms: Your throat may feel dry or sore from the anesthesia or the breathing tube placed in your throat during surgery. If this causes discomfort, gargle with warm salt water. The  discomfort should disappear within 24 hours.  If you had a scopolamine patch placed behind your ear for the management of post- operative nausea and/or vomiting:  1. The medication in the patch is effective for 72 hours, after which it should be removed.  Wrap patch in a tissue and discard in the trash. Wash hands thoroughly with soap and water. 2. You may remove the patch earlier than 72 hours if you experience unpleasant side effects which may include dry mouth, dizziness or visual disturbances. 3. Avoid touching the patch. Wash your hands with soap and water after contact with the patch.

## 2018-08-20 NOTE — Anesthesia Procedure Notes (Signed)
Procedure Name: LMA Insertion Date/Time: 08/20/2018 7:40 AM Performed by: Norva Pavlov, CRNA Pre-anesthesia Checklist: Patient identified, Emergency Drugs available, Suction available and Patient being monitored Patient Re-evaluated:Patient Re-evaluated prior to induction Oxygen Delivery Method: Circle system utilized Preoxygenation: Pre-oxygenation with 100% oxygen Induction Type: IV induction Ventilation: Mask ventilation without difficulty LMA: LMA inserted LMA Size: 3.0 Number of attempts: 1 Airway Equipment and Method: Bite block Placement Confirmation: positive ETCO2 Tube secured with: Tape Dental Injury: Teeth and Oropharynx as per pre-operative assessment

## 2018-08-20 NOTE — Transfer of Care (Signed)
   Last Vitals:  Vitals Value Taken Time  BP 135/94 08/20/2018  8:21 AM  Temp    Pulse 61 08/20/2018  8:22 AM  Resp 13 08/20/2018  8:22 AM  SpO2 100 % 08/20/2018  8:22 AM  Vitals shown include unvalidated device data.  Last Pain:  Vitals:   08/20/18 0548  TempSrc:   PainSc: 3       Patients Stated Pain Goal: 6 (08/20/18 0548) Immediate Anesthesia Transfer of Care Note  Patient: Yvonne Hurst  Procedure(s) Performed: Procedure(s) (LRB): RIGHT URETEROSCOPY/STENT EXCHANGE (Right)  Patient Location: PACU  Anesthesia Type: General  Level of Consciousness: awake, alert  and oriented  Airway & Oxygen Therapy: Patient Spontanous Breathing and Patient connected to nasal cannula oxygen  Post-op Assessment: Report given to PACU RN and Post -op Vital signs reviewed and stable  Post vital signs: Reviewed and stable  Complications: No apparent anesthesia complications

## 2018-08-21 ENCOUNTER — Encounter (HOSPITAL_BASED_OUTPATIENT_CLINIC_OR_DEPARTMENT_OTHER): Payer: Self-pay | Admitting: Urology

## 2019-07-09 ENCOUNTER — Ambulatory Visit: Payer: Managed Care, Other (non HMO) | Attending: Internal Medicine

## 2019-07-09 ENCOUNTER — Other Ambulatory Visit: Payer: Self-pay

## 2019-07-09 DIAGNOSIS — Z20822 Contact with and (suspected) exposure to covid-19: Secondary | ICD-10-CM

## 2019-07-10 LAB — NOVEL CORONAVIRUS, NAA: SARS-CoV-2, NAA: NOT DETECTED

## 2019-07-31 ENCOUNTER — Ambulatory Visit: Payer: Managed Care, Other (non HMO)

## 2019-08-12 ENCOUNTER — Ambulatory Visit: Payer: Managed Care, Other (non HMO) | Attending: Internal Medicine

## 2019-08-12 DIAGNOSIS — Z23 Encounter for immunization: Secondary | ICD-10-CM

## 2019-08-12 NOTE — Progress Notes (Signed)
   Covid-19 Vaccination Clinic  Name:  Yvonne Hurst    MRN: [id number] DOB: Jun 06, 1981  08/12/2019  Yvonne Hurst was observed post Covid-19 immunization for 15 minutes without incidence. She was provided with Vaccine Information Sheet and instruction to access the V-Safe system.   Yvonne Hurst was instructed to call 911 with any severe reactions post vaccine: Marland Kitchen Difficulty breathing  . Swelling of your face and throat  . A fast heartbeat  . A bad rash all over your body  . Dizziness and weakness    Immunizations Administered    Name Date Dose VIS Date Route   Pfizer COVID-19 Vaccine 08/12/2019  8:28 AM 0.3 mL 05/28/2019 Intramuscular   Manufacturer: [employer], [employer]   Lot: [id number]   NDC: [address]-3

## 2019-09-01 ENCOUNTER — Ambulatory Visit: Payer: Managed Care, Other (non HMO) | Attending: Internal Medicine

## 2019-09-01 DIAGNOSIS — Z23 Encounter for immunization: Secondary | ICD-10-CM

## 2019-09-01 NOTE — Progress Notes (Signed)
   Covid-19 Vaccination Clinic  Name:  Yvonne Hurst    MRN: [id number] DOB: 1980/12/18  09/01/2019  [name] was observed post Covid-19 immunization for 15 minutes without incident. She was provided with Vaccine Information Sheet and instruction to access the V-Safe system.   [name] was instructed to call 911 with any severe reactions post vaccine: Marland Kitchen Difficulty breathing  . Swelling of face and throat  . A fast heartbeat  . A bad rash all over body  . Dizziness and weakness   Immunizations Administered    Name Date Dose VIS Date Route   Pfizer COVID-19 Vaccine 09/01/2019  3:55 PM 0.3 mL 05/28/2019 Intramuscular   Manufacturer: [employer], [employer]   Lot: [id number]   NDC: [address]-1

## 2020-06-16 ENCOUNTER — Ambulatory Visit: Payer: Managed Care, Other (non HMO) | Admitting: Plastic Surgery

## 2020-07-21 ENCOUNTER — Ambulatory Visit: Payer: Managed Care, Other (non HMO) | Admitting: Plastic Surgery

## 2020-08-07 ENCOUNTER — Encounter: Payer: Self-pay | Admitting: Plastic Surgery

## 2020-08-07 ENCOUNTER — Other Ambulatory Visit: Payer: Self-pay

## 2020-08-07 ENCOUNTER — Other Ambulatory Visit (HOSPITAL_COMMUNITY)
Admission: RE | Admit: 2020-08-07 | Discharge: 2020-08-07 | Disposition: A | Discharge status: Home / Self Care | Payer: Managed Care, Other (non HMO) | Source: Ambulatory Visit | Attending: Plastic Surgery | Admitting: Plastic Surgery

## 2020-08-07 ENCOUNTER — Ambulatory Visit: Payer: Managed Care, Other (non HMO) | Admitting: Plastic Surgery

## 2020-08-07 DIAGNOSIS — L989 Disorder of the skin and subcutaneous tissue, unspecified: Secondary | ICD-10-CM | POA: Insufficient documentation

## 2020-08-07 NOTE — Progress Notes (Signed)
Procedure Note  Preoperative Dx:  1. Changing skin lesion of left neck 4 mm 2. Skin lesion of right face 7 mm  Postoperative Dx: Same  Procedure:  1. Excision of changing skin lesion of neck 4 mm 2. Excision of skin lesion of face 7 mm  Anesthesia: Lidocaine 1% with 1:100,000 epinepherine  Indication for Procedure: skin lesions  Description of Procedure: Risks and complications were explained to the patient.  Consent was confirmed and the patient understands the risks and benefits.  The potential complications and alternatives were explained and the patient consents.  The patient expressed understanding the option of not having the procedure and the risks of a scar.  Time out was called and all information was confirmed to be correct.    The area was prepped and drapped.  Lidocaine 1% with epinepherine was injected in the subcutaneous area.    Left neck:  After waiting several minutes for the local to take affect a #15 blade was used to excise the area in an eliptical pattern.  The skin edges were reapproximated with 5-0 Monocryl.  A dressing was applied.  The specimen was sent to pathology.  Right face:  After waiting several minutes for the local to take affect a #15 blade was used to excise the area in an eliptical pattern.  The skin edges were reapproximated with 5-0 Monocryl subcuticular running closure.  A dressing was applied.  The patient was given instructions on how to care for the area and a follow up appointment.  Yvonne Hurst tolerated the procedure well and there were no complications.

## 2020-08-08 LAB — SURGICAL PATHOLOGY

## 2020-08-09 ENCOUNTER — Telehealth: Payer: Self-pay

## 2020-08-09 NOTE — Telephone Encounter (Signed)
Patient said the steri-strip covering the stitches on her neck is ok but the one covering her face is very bloody and dirty looking.  Patient would like to know at what point can she peel this one off and put the new one on?  Please call.

## 2020-08-09 NOTE — Telephone Encounter (Signed)
Call to pt per Dr. Ulice Bold- to inform pt of her pathology report which was benign. She reports that both incisions are healing well- she has minimal discomfort & no signs of any complications. She has a f/u next week

## 2020-08-11 ENCOUNTER — Telehealth: Payer: Self-pay

## 2020-08-11 NOTE — Telephone Encounter (Signed)
Call returned to pt regarding her question about whether she can remove her steri-strip d/t it had dried blood stains on it. I instructed her to place a clean bandaid over it- but she did choose to remove the steri-strip & replace it with another steri-strip that Dr.Dillingham had given her at the time of surgery. She reports that the incision remains intact & she has no complications.  She denies any drainage/redness & no chills/fever. She has f/u next week.

## 2020-08-15 ENCOUNTER — Ambulatory Visit: Payer: Managed Care, Other (non HMO) | Admitting: Surgical

## 2020-08-15 ENCOUNTER — Encounter: Payer: Self-pay | Admitting: Surgical

## 2020-08-15 ENCOUNTER — Other Ambulatory Visit: Payer: Self-pay

## 2020-08-15 VITALS — BP 97/71 | HR 57

## 2020-08-15 DIAGNOSIS — L989 Disorder of the skin and subcutaneous tissue, unspecified: Secondary | ICD-10-CM

## 2020-08-15 NOTE — Progress Notes (Signed)
Patient is a 40 year old female here for follow-up after excision of changing skin lesion of her left neck and excision of skin lesion of right face with Dr. Ulice Bold on 08/07/2020.  She reports she is doing well.  She applied additional Steri-Strips to the area to keep it covered.  She is not having any infectious symptoms.  Patient reports she is aware of the pathology results, she was provided these over the phone.  On exam left neck and right cheek incisions are well-healed.  Monocryl suture knots noted in place.  No erythema.  No drainage noted.  Monocryl suture knots were removed, patient tolerated fine.  Recommend keeping the area protected from the sun with sunscreen to avoid discoloration of the incision.  Recommend starting to use a scar cream in the next 3 to 5 days.  Recommend applying twice per day for 3 months.  Recommend calling with any questions or concerns, recommend following up as needed.  All of her questions were answered to her content.

## 2020-08-18 ENCOUNTER — Telehealth: Payer: Self-pay

## 2020-08-18 NOTE — Telephone Encounter (Signed)
Patient called to say she came in last week for [facility] to remove her stitches and she thinks they may have come out too early.  Patient would like to make sure that Dr. Ulice Bold is aware and wants to be sure it's not too late to make it better.  Patient said the place on her neck is a circular area that is turning into a scab.  Patient would like to know what Dr. Ulice Bold thinks regarding her next course of action and whether or not she thinks scar cream would be helpful.  Please call.

## 2020-08-21 NOTE — Telephone Encounter (Signed)
Returned patients call. LMVM, questioned if incision is healing open, or together. Both Dr. Ulice Bold and Susy Frizzle are in surgery today. Once I discuss her situation with them, I will call patient back.

## 2020-08-22 NOTE — Telephone Encounter (Signed)
Spoke with Dr. Ulice Bold. Patient can come in and see her or Matt. Until then apply Vaseline on the incision area. Front desk with give her a call to schedule appointment.

## 2021-07-27 ENCOUNTER — Encounter: Payer: Self-pay | Admitting: Plastic Surgery

## 2021-07-27 ENCOUNTER — Other Ambulatory Visit (HOSPITAL_COMMUNITY)
Admission: RE | Admit: 2021-07-27 | Discharge: 2021-07-27 | Disposition: A | Discharge status: Home / Self Care | Payer: Managed Care, Other (non HMO) | Source: Ambulatory Visit | Attending: Plastic Surgery | Admitting: Plastic Surgery

## 2021-07-27 ENCOUNTER — Ambulatory Visit (INDEPENDENT_AMBULATORY_CARE_PROVIDER_SITE_OTHER): Payer: Managed Care, Other (non HMO) | Admitting: Plastic Surgery

## 2021-07-27 ENCOUNTER — Other Ambulatory Visit: Payer: Self-pay

## 2021-07-27 VITALS — BP 120/79 | HR 73

## 2021-07-27 DIAGNOSIS — L989 Disorder of the skin and subcutaneous tissue, unspecified: Secondary | ICD-10-CM

## 2021-07-27 NOTE — Addendum Note (Signed)
Addended by: Peggye Form on: 07/27/2021 04:38 PM   Modules accepted: Orders

## 2021-07-27 NOTE — Progress Notes (Signed)
Procedure Note  Preoperative Dx: changing skin lesion  Chest 5 mm Shoulder 4 mm  Neck 5 mm  Postoperative Dx: Same  Procedure: excision of changing skin lesion  Chest 5 mm Shoulder 4 mm Neck 5 mm  Anesthesia: Lidocaine 1% with 1:100,000 epinephrine  Description of Procedure: Risks and complications were explained to the patient.  Consent was confirmed and the patient understands the risks and benefits.  The potential complications and alternatives were explained and the patient consents.  The patient expressed understanding the option of not having the procedure and the risks of a scar.  Time out was called and all information was confirmed to be correct.    The area was prepped and drapped.  Lidocaine 1% with epinepherine was injected in the subcutaneous area.    Chest: After waiting several minutes for the local to take affect a #15 blade was used to excise the area in an eliptical pattern.  The skin edges were reapproximated with 6-0 Monocryl.  A dressing was applied.    Shoulder: After waiting several minutes for the local to take affect a #15 blade was used to excise the area in an eliptical pattern.  The skin edges were reapproximated with 6-0 Monocryl.  A dressing was applied.    Neck:  After waiting several minutes for the local to take affect a #15 blade was used to excise the area in an eliptical pattern.  The skin edges were reapproximated with 6-0 Monocryl.  A dressing was applied.  This was hyperpigmented and therefore selected for path evaluation. The specimen was sent to pathology.  The patient was given instructions on how to care for the area and a follow up appointment.  Yvonne Hurst tolerated the procedure well and there were no complications.

## 2021-08-03 LAB — SURGICAL PATHOLOGY

## 2021-08-14 ENCOUNTER — Ambulatory Visit: Payer: Managed Care, Other (non HMO) | Admitting: Surgical

## 2021-08-14 ENCOUNTER — Other Ambulatory Visit: Payer: Self-pay

## 2021-08-14 DIAGNOSIS — L989 Disorder of the skin and subcutaneous tissue, unspecified: Secondary | ICD-10-CM

## 2021-08-14 NOTE — Progress Notes (Signed)
41 year old female here for follow-up after excision of neck, shoulder and chest lesion with Dr. Marla Roe.  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  Patient is healing well.  She is not having any issues.  Chaperone present on exam On exam 3 incisions are intact and healing well.  No erythema or cellulitic changes noted. No open wounds noted.  Steri-Strips and sutures are removed, patient tolerated this well.  Recommend following up as needed.  Call with questions or concerns.  No signs of infection.

## 2022-05-17 ENCOUNTER — Other Ambulatory Visit: Payer: Self-pay | Admitting: Internal Medicine

## 2022-05-17 ENCOUNTER — Ambulatory Visit
Admission: RE | Admit: 2022-05-17 | Discharge: 2022-05-17 | Disposition: A | Discharge status: Home / Self Care | Payer: Managed Care, Other (non HMO) | Source: Ambulatory Visit | Attending: Internal Medicine | Admitting: Internal Medicine

## 2022-05-17 DIAGNOSIS — R0781 Pleurodynia: Secondary | ICD-10-CM

## 2022-08-30 ENCOUNTER — Other Ambulatory Visit (HOSPITAL_COMMUNITY): Payer: Self-pay | Admitting: Internal Medicine

## 2022-08-30 DIAGNOSIS — R1012 Left upper quadrant pain: Secondary | ICD-10-CM

## 2022-08-31 ENCOUNTER — Ambulatory Visit (HOSPITAL_BASED_OUTPATIENT_CLINIC_OR_DEPARTMENT_OTHER)
Admission: RE | Admit: 2022-08-31 | Discharge: 2022-08-31 | Disposition: A | Discharge status: Home / Self Care | Payer: Managed Care, Other (non HMO) | Source: Ambulatory Visit | Attending: Internal Medicine | Admitting: Internal Medicine

## 2022-08-31 DIAGNOSIS — R1012 Left upper quadrant pain: Secondary | ICD-10-CM | POA: Insufficient documentation
